# Patient Record
Sex: Male | Born: 1945 | Race: White | Hispanic: No | Smoking: Former smoker
Health system: Southern US, Community
[De-identification: ages and names within clinical notes are randomized; demographics above are authoritative.]

## PROBLEM LIST (undated history)

## (undated) DIAGNOSIS — M1990 Unspecified osteoarthritis, unspecified site: Secondary | ICD-10-CM

## (undated) DIAGNOSIS — N200 Calculus of kidney: Secondary | ICD-10-CM

## (undated) DIAGNOSIS — F32A Depression, unspecified: Secondary | ICD-10-CM

## (undated) DIAGNOSIS — D696 Thrombocytopenia, unspecified: Secondary | ICD-10-CM

## (undated) DIAGNOSIS — K2100 Gastro-esophageal reflux disease with esophagitis, without bleeding: Secondary | ICD-10-CM

## (undated) DIAGNOSIS — F329 Major depressive disorder, single episode, unspecified: Secondary | ICD-10-CM

## (undated) DIAGNOSIS — F419 Anxiety disorder, unspecified: Secondary | ICD-10-CM

## (undated) DIAGNOSIS — K219 Gastro-esophageal reflux disease without esophagitis: Secondary | ICD-10-CM

## (undated) DIAGNOSIS — K648 Other hemorrhoids: Secondary | ICD-10-CM

## (undated) DIAGNOSIS — J189 Pneumonia, unspecified organism: Secondary | ICD-10-CM

## (undated) DIAGNOSIS — I10 Essential (primary) hypertension: Secondary | ICD-10-CM

## (undated) DIAGNOSIS — Z87442 Personal history of urinary calculi: Secondary | ICD-10-CM

## (undated) DIAGNOSIS — D369 Benign neoplasm, unspecified site: Secondary | ICD-10-CM

## (undated) DIAGNOSIS — M549 Dorsalgia, unspecified: Secondary | ICD-10-CM

## (undated) DIAGNOSIS — M170 Bilateral primary osteoarthritis of knee: Secondary | ICD-10-CM

## (undated) DIAGNOSIS — M25569 Pain in unspecified knee: Secondary | ICD-10-CM

## (undated) HISTORY — PX: COLONOSCOPY: SHX174

## (undated) HISTORY — PX: VASECTOMY: SHX75

## (undated) HISTORY — PX: KNEE SURGERY: SHX244

---

## 2008-05-15 ENCOUNTER — Ambulatory Visit: Payer: Self-pay | Admitting: Urology

## 2008-05-27 ENCOUNTER — Ambulatory Visit: Payer: Self-pay | Admitting: Gastroenterology

## 2010-08-02 ENCOUNTER — Emergency Department (HOSPITAL_COMMUNITY): Admission: EM | Admit: 2010-08-02 | Discharge: 2010-08-02 | Payer: Self-pay | Admitting: Emergency Medicine

## 2010-11-11 ENCOUNTER — Encounter
Admission: RE | Admit: 2010-11-11 | Discharge: 2010-11-11 | Payer: Self-pay | Source: Home / Self Care | Admitting: Neurosurgery

## 2011-02-19 LAB — COMPREHENSIVE METABOLIC PANEL
ALT: 22 U/L (ref 0–53)
AST: 31 U/L (ref 0–37)
Albumin: 4 g/dL (ref 3.5–5.2)
CO2: 27 mEq/L (ref 19–32)
Chloride: 101 mEq/L (ref 96–112)
GFR calc Af Amer: >60 mL/min (ref >60)
GFR calc non Af Amer: >60 mL/min (ref >60)
Potassium: 3.8 mEq/L (ref 3.5–5.1)
Sodium: 131 mEq/L — ABNORMAL LOW (ref 135–145)
Total Bilirubin: 0.8 mg/dL (ref 0.3–1.2)

## 2011-02-19 LAB — SAMPLE TO BLOOD BANK

## 2011-02-19 LAB — LACTIC ACID, PLASMA: Lactic Acid, Venous: 1.8 mmol/L (ref 0.5–2.2)

## 2011-02-19 LAB — POCT I-STAT, CHEM 8
BUN: 18 mg/dL (ref 6–23)
Calcium, Ion: 1.08 mmol/L — ABNORMAL LOW (ref 1.12–1.32)
Chloride: 103 mEq/L (ref 96–112)
Glucose, Bld: 109 mg/dL — ABNORMAL HIGH (ref 70–99)
HCT: 49 % (ref 39.0–52.0)
Potassium: 4 mEq/L (ref 3.5–5.1)

## 2011-02-19 LAB — CBC
Hemoglobin: 15.9 g/dL (ref 13.0–17.0)
Platelets: 110 10*3/uL — ABNORMAL LOW (ref 150–400)
RBC: 5.2 MIL/uL (ref 4.22–5.81)
WBC: 9.3 10*3/uL (ref 4.0–10.5)

## 2011-08-13 ENCOUNTER — Ambulatory Visit: Payer: Self-pay | Admitting: Gastroenterology

## 2014-07-22 DIAGNOSIS — G894 Chronic pain syndrome: Secondary | ICD-10-CM | POA: Insufficient documentation

## 2014-08-06 ENCOUNTER — Emergency Department (HOSPITAL_COMMUNITY)
Admission: EM | Admit: 2014-08-06 | Discharge: 2014-08-06 | Disposition: A | Discharge status: Home / Self Care | Payer: Medicare Other | Attending: Emergency Medicine | Admitting: Emergency Medicine

## 2014-08-06 ENCOUNTER — Encounter (HOSPITAL_COMMUNITY): Payer: Self-pay | Admitting: Emergency Medicine

## 2014-08-06 DIAGNOSIS — Y9389 Activity, other specified: Secondary | ICD-10-CM | POA: Insufficient documentation

## 2014-08-06 DIAGNOSIS — H5710 Ocular pain, unspecified eye: Secondary | ICD-10-CM | POA: Diagnosis present

## 2014-08-06 DIAGNOSIS — F17200 Nicotine dependence, unspecified, uncomplicated: Secondary | ICD-10-CM | POA: Insufficient documentation

## 2014-08-06 DIAGNOSIS — S0501XA Injury of conjunctiva and corneal abrasion without foreign body, right eye, initial encounter: Secondary | ICD-10-CM

## 2014-08-06 DIAGNOSIS — S0502XA Injury of conjunctiva and corneal abrasion without foreign body, left eye, initial encounter: Secondary | ICD-10-CM

## 2014-08-06 DIAGNOSIS — S058X9A Other injuries of unspecified eye and orbit, initial encounter: Secondary | ICD-10-CM | POA: Insufficient documentation

## 2014-08-06 DIAGNOSIS — X58XXXA Exposure to other specified factors, initial encounter: Secondary | ICD-10-CM | POA: Insufficient documentation

## 2014-08-06 DIAGNOSIS — Y929 Unspecified place or not applicable: Secondary | ICD-10-CM | POA: Diagnosis not present

## 2014-08-06 HISTORY — DX: Pain in unspecified knee: M25.569

## 2014-08-06 HISTORY — DX: Dorsalgia, unspecified: M54.9

## 2014-08-06 MED ORDER — TETRACAINE HCL 0.5 % OP SOLN
OPHTHALMIC | Status: AC
  Administered 2014-08-06: 20:00:00
  Filled 2014-08-06: qty 2

## 2014-08-06 MED ORDER — FLUORESCEIN SODIUM 1 MG OP STRP
ORAL_STRIP | OPHTHALMIC | Status: AC
  Administered 2014-08-06: 20:00:00
  Filled 2014-08-06: qty 1

## 2014-08-06 MED ORDER — ERYTHROMYCIN 5 MG/GM OP OINT: TOPICAL_OINTMENT | OPHTHALMIC | Status: DC

## 2014-08-06 MED ORDER — HYPROMELLOSE (GONIOSCOPIC) 2.5 % OP SOLN: 2.0000 [drp] | Freq: Three times a day (TID) | OPHTHALMIC | Status: DC | PRN

## 2014-08-06 MED ORDER — HYDROCODONE-ACETAMINOPHEN 5-325 MG PO TABS: 1.0000 | ORAL_TABLET | ORAL | Status: DC | PRN

## 2014-08-06 NOTE — ED Provider Notes (Signed)
TIME SEEN: 7:29 PM  CHIEF COMPLAINT: Bilateral eye pain, tearing, blurry vision  HPI: Patient is a 68 y.o. M with history of tobacco use who presents to the emergency department complaints of bilateral ear pain, photophobia, tearing. He reports that yesterday he was working with his friend helping to put on floors and he was using a small. He states there was a lot of dust in the room. He states he woke up this morning and he felt his eyes were very irritated and tearing. He did have some mild blurry vision. He states that throughout the day his eyes got worse and worse and he kept rubbing them. He is now having pain and unable to keep his eyes open in the light. Denies any vision loss. Denies any headache, vomiting. He does not wear glasses or contacts. Does not have an optometrist or ophthalmologist.  ROS: See HPI Constitutional: no fever  Eyes: no drainage  ENT: no runny nose   Cardiovascular:  no chest pain  Resp: no SOB  GI: no vomiting GU: no dysuria Integumentary: no rash  Allergy: no hives  Musculoskeletal: no leg swelling  Neurological: no slurred speech ROS otherwise negative  PAST MEDICAL HISTORY/PAST SURGICAL HISTORY:  Past Medical History  Diagnosis Date  . Back pain   . Knee pain     MEDICATIONS:  Prior to Admission medications   Not on File    ALLERGIES:  Allergies  Allergen Reactions  . Sulfa Antibiotics     SOCIAL HISTORY:  History  Substance Use Topics  . Smoking status: Current Every Day Smoker  . Smokeless tobacco: Not on file  . Alcohol Use: No    FAMILY HISTORY: No family history on file.  EXAM: BP 175/89  Pulse 55  Temp(Src) 97.9 F (36.6 C) (Oral)  Resp 18  Ht 5\' 11"  (1.803 m)  Wt 236 lb (107.049 kg)  BMI 32.93 kg/m2  SpO2 98% CONSTITUTIONAL: Alert and oriented and responds appropriately to questions. Well-appearing; well-nourished HEAD: Normocephalic EYES:  PERRL, no pain with consensual light response, extraocular movements intact  and painless, mild conjunctival irritation bilaterally, no scleral icterus, no hyphema or hypopyon, patient has photophobia, there is increased tearing, with fluoroscopy seen staining patient has bilateral large central corneal abrasions, no corneal ulcer, normal funduscopic exam, no papilledema, extraocular pressure and ties is measured at 17 mm of mercury ENT: normal nose; no rhinorrhea; moist mucous membranes; pharynx without lesions noted NECK: Supple, no meningismus, no LAD  CARD: RRR; S1 and S2 appreciated; no murmurs, no clicks, no rubs, no gallops RESP: Normal chest excursion without splinting or tachypnea; breath sounds clear and equal bilaterally; no wheezes, no rhonchi, no rales,  ABD/GI: Normal bowel sounds; non-distended; soft, non-tender, no rebound, no guarding BACK:  The back appears normal and is non-tender to palpation, there is no CVA tenderness EXT: Normal ROM in all joints; non-tender to palpation; no edema; normal capillary refill; no cyanosis    SKIN: Normal color for age and race; warm NEURO: Moves all extremities equally PSYCH: The patient's mood and manner are appropriate. Grooming and personal hygiene are appropriate.  MEDICAL DECISION MAKING: Patient here with likely mild allergic conjunctivitis and bilateral corneal abrasions from rubbing his eyes continuously today. No sign of globe injury. No hyphema. His vision improves after his eyes have been anesthetized using tetracaine. Vision is 20/50 in bilateral eyes. Have advised him that he should not drive or work until his symptoms have resolved. We'll give ophthalmology followup. We'll discharge  with artificial tears, erythromycin ointment and pain medication. Have discussed with him return precautions and supportive care instructions. Advised him to stop rubbing his eyes. Patient verbalizes understanding and is comfortable with plan.      [place], DO 08/06/14 2141

## 2014-08-06 NOTE — ED Notes (Signed)
Pt says he is not able at this time to so visual acuity due to sensitive to light and eyes are painful when air hits them.

## 2014-08-06 NOTE — Discharge Instructions (Signed)
Corneal Abrasion °The cornea is the clear covering at the front and center of the eye. When looking at the colored portion of the eye (iris), you are looking through the cornea. This very thin tissue is made up of many layers. The surface layer is a single layer of cells (corneal epithelium) and is one of the most sensitive tissues in the body. If a scratch or injury causes the corneal epithelium to come off, it is called a corneal abrasion. If the injury extends to the tissues below the epithelium, the condition is called a corneal ulcer. °CAUSES  °· Scratches. °· Trauma. °· Foreign body in the eye. °Some people have recurrences of abrasions in the area of the original injury even after it has healed (recurrent erosion syndrome). Recurrent erosion syndrome generally improves and goes away with time. °SYMPTOMS  °· Eye pain. °· Difficulty or inability to keep the injured eye open. °· The eye becomes very sensitive to light. °· Recurrent erosions tend to happen suddenly, first thing in the morning, usually after waking up and opening the eye. °DIAGNOSIS  °Your health care provider can diagnose a corneal abrasion during an eye exam. Dye is usually placed in the eye using a drop or a small paper strip moistened by your tears. When the eye is examined with a special light, the abrasion shows up clearly because of the dye. °TREATMENT  °· Small abrasions may be treated with antibiotic drops or ointment alone. °· A pressure patch may be put over the eye. If this is done, follow your doctor's instructions for when to remove the patch. Do not drive or use machines while the eye patch is on. Judging distances is hard to do with a patch on. °If the abrasion becomes infected and spreads to the deeper tissues of the cornea, a corneal ulcer can result. This is serious because it can cause corneal scarring. Corneal scars interfere with light passing through the cornea and cause a loss of vision in the involved eye. °HOME CARE  INSTRUCTIONS °· Use medicine or ointment as directed. Only take over-the-counter or prescription medicines for pain, discomfort, or fever as directed by your health care provider. °· Do not drive or operate machinery if your eye is patched. Your ability to judge distances is impaired. °· If your health care provider has given you a follow-up appointment, it is very important to keep that appointment. Not keeping the appointment could result in a severe eye infection or permanent loss of vision. If there is any problem keeping the appointment, let your health care provider know. °SEEK MEDICAL CARE IF:  °· You have pain, light sensitivity, and a scratchy feeling in one eye or both eyes. °· Your pressure patch keeps loosening up, and you can blink your eye under the patch after treatment. °· Any kind of discharge develops from the eye after treatment or if the lids stick together in the morning. °· You have the same symptoms in the morning as you did with the original abrasion days, weeks, or months after the abrasion healed. °MAKE SURE YOU:  °· Understand these instructions. °· Will watch your condition. °· Will get help right away if you are not doing well or get worse. °Document Released: 11/19/2000 Document Revised: 11/27/2013 Document Reviewed: 07/30/2013 °ExitCare® Patient Information ©2015 [employer]. This information is not intended to replace advice given to you by your health care provider. Make sure you discuss any questions you have with your health care provider. ° °

## 2014-08-06 NOTE — ED Notes (Signed)
Pt reports woke up this morning with "foggy" vision and as the day went on eyes started to water, swell,  and burn.  Pt says is very sensitive to light and is unable to hold his eyes open.  Says woke up with runny nose this morning.  Denies getting anything in eyes.

## 2014-09-16 ENCOUNTER — Ambulatory Visit: Payer: Self-pay | Admitting: Gastroenterology

## 2014-09-16 LAB — CBC WITH DIFFERENTIAL/PLATELET
BASOS PCT: 1.1 %
Basophil #: 0.1 10*3/uL (ref 0.0–0.1)
EOS ABS: 0.1 10*3/uL (ref 0.0–0.7)
EOS PCT: 1.4 %
HCT: 45.8 % (ref 40.0–52.0)
HGB: 14.7 g/dL (ref 13.0–18.0)
LYMPHS PCT: 19 %
Lymphocyte #: 1.4 10*3/uL (ref 1.0–3.6)
MCH: 28.4 pg (ref 26.0–34.0)
MCHC: 32 g/dL (ref 32.0–36.0)
MCV: 89 fL (ref 80–100)
Monocyte #: 0.5 x10 3/mm (ref 0.2–1.0)
Monocyte %: 7.1 %
Neutrophil #: 5.3 10*3/uL (ref 1.4–6.5)
Neutrophil %: 71.4 %
PLATELETS: 121 10*3/uL — AB (ref 150–440)
RBC: 5.16 10*6/uL (ref 4.40–5.90)
RDW: 13.5 % (ref 11.5–14.5)
WBC: 7.4 10*3/uL (ref 3.8–10.6)

## 2014-09-18 LAB — PATHOLOGY REPORT

## 2015-01-04 ENCOUNTER — Emergency Department: Payer: Self-pay | Admitting: Internal Medicine

## 2015-01-04 LAB — URINALYSIS, COMPLETE
BLOOD: NEGATIVE
Bacteria: NONE SEEN
Bilirubin,UR: NEGATIVE
Glucose,UR: NEGATIVE mg/dL (ref 0–75)
Ketone: NEGATIVE
LEUKOCYTE ESTERASE: NEGATIVE
NITRITE: NEGATIVE
PROTEIN: NEGATIVE
Ph: 5 (ref 4.5–8.0)
RBC,UR: <1 /HPF (ref 0–5)
SPECIFIC GRAVITY: 1.016 (ref 1.003–1.030)
Squamous Epithelial: NONE SEEN
WBC UR: 1 /HPF (ref 0–5)

## 2015-01-04 LAB — COMPREHENSIVE METABOLIC PANEL
ALBUMIN: 3.8 g/dL (ref 3.4–5.0)
Alkaline Phosphatase: 71 U/L (ref 46–116)
Anion Gap: 2 — ABNORMAL LOW (ref 7–16)
BUN: 23 mg/dL — ABNORMAL HIGH (ref 7–18)
Bilirubin,Total: 0.6 mg/dL (ref 0.2–1.0)
CALCIUM: 8.7 mg/dL (ref 8.5–10.1)
Chloride: 99 mmol/L (ref 98–107)
Co2: 36 mmol/L — ABNORMAL HIGH (ref 21–32)
Creatinine: 1.03 mg/dL (ref 0.60–1.30)
EGFR (African American): >60
EGFR (Non-African Amer.): >60
GLUCOSE: 95 mg/dL (ref 65–99)
OSMOLALITY: 277 (ref 275–301)
POTASSIUM: 4.6 mmol/L (ref 3.5–5.1)
SGOT(AST): 18 U/L (ref 15–37)
SGPT (ALT): 24 U/L (ref 14–63)
SODIUM: 137 mmol/L (ref 136–145)
Total Protein: 7 g/dL (ref 6.4–8.2)

## 2015-01-04 LAB — CBC
HCT: 49.8 % (ref 40.0–52.0)
HGB: 16.6 g/dL (ref 13.0–18.0)
MCH: 28.9 pg (ref 26.0–34.0)
MCHC: 33.2 g/dL (ref 32.0–36.0)
MCV: 87 fL (ref 80–100)
Platelet: 102 10*3/uL — ABNORMAL LOW (ref 150–440)
RBC: 5.74 10*6/uL (ref 4.40–5.90)
RDW: 14.9 % — ABNORMAL HIGH (ref 11.5–14.5)
WBC: 12.1 10*3/uL — ABNORMAL HIGH (ref 3.8–10.6)

## 2015-01-08 ENCOUNTER — Encounter (HOSPITAL_COMMUNITY): Payer: Self-pay | Admitting: *Deleted

## 2015-01-09 ENCOUNTER — Encounter (HOSPITAL_COMMUNITY): Payer: Self-pay | Admitting: *Deleted

## 2015-01-09 ENCOUNTER — Ambulatory Visit (HOSPITAL_COMMUNITY)
Admission: RE | Admit: 2015-01-09 | Discharge: 2015-01-10 | Disposition: A | Discharge status: Home / Self Care | Payer: Medicare Other | Source: Ambulatory Visit | Attending: Neurosurgery | Admitting: Neurosurgery

## 2015-01-09 ENCOUNTER — Encounter (HOSPITAL_COMMUNITY)
Admission: RE | Disposition: A | Discharge status: Home / Self Care | Payer: Self-pay | Source: Ambulatory Visit | Attending: Neurosurgery

## 2015-01-09 ENCOUNTER — Inpatient Hospital Stay (HOSPITAL_COMMUNITY): Payer: Medicare Other | Admitting: Critical Care Medicine

## 2015-01-09 ENCOUNTER — Inpatient Hospital Stay (HOSPITAL_COMMUNITY): Payer: Medicare Other

## 2015-01-09 DIAGNOSIS — Z87442 Personal history of urinary calculi: Secondary | ICD-10-CM | POA: Insufficient documentation

## 2015-01-09 DIAGNOSIS — F329 Major depressive disorder, single episode, unspecified: Secondary | ICD-10-CM | POA: Insufficient documentation

## 2015-01-09 DIAGNOSIS — M1990 Unspecified osteoarthritis, unspecified site: Secondary | ICD-10-CM | POA: Diagnosis not present

## 2015-01-09 DIAGNOSIS — Z882 Allergy status to sulfonamides status: Secondary | ICD-10-CM | POA: Insufficient documentation

## 2015-01-09 DIAGNOSIS — Z79899 Other long term (current) drug therapy: Secondary | ICD-10-CM | POA: Insufficient documentation

## 2015-01-09 DIAGNOSIS — M5126 Other intervertebral disc displacement, lumbar region: Secondary | ICD-10-CM | POA: Diagnosis present

## 2015-01-09 HISTORY — DX: Depression, unspecified: F32.A

## 2015-01-09 HISTORY — DX: Essential (primary) hypertension: I10

## 2015-01-09 HISTORY — DX: Calculus of kidney: N20.0

## 2015-01-09 HISTORY — PX: LUMBAR LAMINECTOMY/DECOMPRESSION MICRODISCECTOMY: SHX5026

## 2015-01-09 HISTORY — DX: Unspecified osteoarthritis, unspecified site: M19.90

## 2015-01-09 HISTORY — DX: Major depressive disorder, single episode, unspecified: F32.9

## 2015-01-09 LAB — CBC
HCT: 46.1 % (ref 39.0–52.0)
Hemoglobin: 15.3 g/dL (ref 13.0–17.0)
MCH: 28.9 pg (ref 26.0–34.0)
MCHC: 33.2 g/dL (ref 30.0–36.0)
MCV: 87.1 fL (ref 78.0–100.0)
Platelets: 102 10*3/uL — ABNORMAL LOW (ref 150–400)
RBC: 5.29 MIL/uL (ref 4.22–5.81)
RDW: 14.6 % (ref 11.5–15.5)
WBC: 10.6 10*3/uL — AB (ref 4.0–10.5)

## 2015-01-09 LAB — BASIC METABOLIC PANEL
Anion gap: 7 (ref 5–15)
BUN: 25 mg/dL — ABNORMAL HIGH (ref 6–23)
CO2: 30 mmol/L (ref 19–32)
Calcium: 8.9 mg/dL (ref 8.4–10.5)
Chloride: 103 mmol/L (ref 96–112)
Creatinine, Ser: 1.1 mg/dL (ref 0.50–1.35)
GFR calc Af Amer: 79 mL/min — ABNORMAL LOW (ref >90)
GFR calc non Af Amer: 68 mL/min — ABNORMAL LOW (ref >90)
Glucose, Bld: 80 mg/dL (ref 70–99)
Potassium: 5 mmol/L (ref 3.5–5.1)
Sodium: 140 mmol/L (ref 135–145)

## 2015-01-09 LAB — SURGICAL PCR SCREEN
MRSA, PCR: NEGATIVE
Staphylococcus aureus: NEGATIVE

## 2015-01-09 SURGERY — LUMBAR LAMINECTOMY/DECOMPRESSION MICRODISCECTOMY 1 LEVEL
Anesthesia: General | Site: Spine Lumbar | Laterality: Left

## 2015-01-09 MED ORDER — PROPOFOL 10 MG/ML IV BOLUS
INTRAVENOUS | Status: AC
  Filled 2015-01-09: qty 20

## 2015-01-09 MED ORDER — THROMBIN 5000 UNITS EX SOLR
CUTANEOUS | Status: DC | PRN
  Administered 2015-01-09 (×2): 5000 [IU] via TOPICAL

## 2015-01-09 MED ORDER — FENTANYL CITRATE 0.05 MG/ML IJ SOLN
INTRAMUSCULAR | Status: AC
  Filled 2015-01-09: qty 5

## 2015-01-09 MED ORDER — HYDROCODONE-ACETAMINOPHEN 5-325 MG PO TABS: 1.0000 | ORAL_TABLET | ORAL | Status: DC | PRN

## 2015-01-09 MED ORDER — POLYETHYLENE GLYCOL 3350 17 G PO PACK
17.0000 g | PACK | Freq: Every day | ORAL | Status: DC | PRN
  Filled 2015-01-09: qty 1

## 2015-01-09 MED ORDER — SODIUM CHLORIDE 0.9 % IJ SOLN
3.0000 mL | Freq: Two times a day (BID) | INTRAMUSCULAR | Status: DC
  Administered 2015-01-09: 3 mL via INTRAVENOUS

## 2015-01-09 MED ORDER — LACTATED RINGERS IV SOLN
INTRAVENOUS | Status: DC | PRN
  Administered 2015-01-09 (×3): via INTRAVENOUS

## 2015-01-09 MED ORDER — HYDROMORPHONE HCL 1 MG/ML IJ SOLN
0.2500 mg | INTRAMUSCULAR | Status: DC | PRN
  Administered 2015-01-09: 0.5 mg via INTRAVENOUS
  Administered 2015-01-09 (×2): 0.25 mg via INTRAVENOUS

## 2015-01-09 MED ORDER — LIDOCAINE-EPINEPHRINE 0.5 %-1:200000 IJ SOLN
INTRAMUSCULAR | Status: DC | PRN
  Administered 2015-01-09: 10 mL

## 2015-01-09 MED ORDER — ONDANSETRON HCL 4 MG/2ML IJ SOLN
INTRAMUSCULAR | Status: DC | PRN
  Administered 2015-01-09: 4 mg via INTRAVENOUS

## 2015-01-09 MED ORDER — 0.9 % SODIUM CHLORIDE (POUR BTL) OPTIME
TOPICAL | Status: DC | PRN
  Administered 2015-01-09: 1000 mL

## 2015-01-09 MED ORDER — ROCURONIUM BROMIDE 100 MG/10ML IV SOLN
INTRAVENOUS | Status: DC | PRN
  Administered 2015-01-09: 40 mg via INTRAVENOUS

## 2015-01-09 MED ORDER — LIDOCAINE HCL (CARDIAC) 20 MG/ML IV SOLN
INTRAVENOUS | Status: DC | PRN
  Administered 2015-01-09: 100 mg via INTRAVENOUS

## 2015-01-09 MED ORDER — SENNA 8.6 MG PO TABS
1.0000 | ORAL_TABLET | Freq: Two times a day (BID) | ORAL | Status: DC
  Administered 2015-01-09: 8.6 mg via ORAL
  Filled 2015-01-09 (×3): qty 1

## 2015-01-09 MED ORDER — PROMETHAZINE HCL 25 MG/ML IJ SOLN
6.2500 mg | INTRAMUSCULAR | Status: DC | PRN
Start: 2015-01-09 — End: 2015-01-09

## 2015-01-09 MED ORDER — ARTIFICIAL TEARS OP OINT
TOPICAL_OINTMENT | OPHTHALMIC | Status: DC | PRN
  Administered 2015-01-09: 1 via OPHTHALMIC

## 2015-01-09 MED ORDER — ONDANSETRON HCL 4 MG/2ML IJ SOLN
INTRAMUSCULAR | Status: AC
  Filled 2015-01-09: qty 2

## 2015-01-09 MED ORDER — STERILE WATER FOR INJECTION IJ SOLN
INTRAMUSCULAR | Status: AC
  Filled 2015-01-09: qty 10

## 2015-01-09 MED ORDER — HEMOSTATIC AGENTS (NO CHARGE) OPTIME
TOPICAL | Status: DC | PRN
  Administered 2015-01-09: 1 via TOPICAL

## 2015-01-09 MED ORDER — ACETAMINOPHEN 650 MG RE SUPP: 650.0000 mg | RECTAL | Status: DC | PRN

## 2015-01-09 MED ORDER — HYDROMORPHONE HCL 1 MG/ML IJ SOLN: 0.5000 mg | INTRAMUSCULAR | Status: DC | PRN

## 2015-01-09 MED ORDER — MIDAZOLAM HCL 2 MG/2ML IJ SOLN
INTRAMUSCULAR | Status: AC
  Filled 2015-01-09: qty 2

## 2015-01-09 MED ORDER — EPHEDRINE SULFATE 50 MG/ML IJ SOLN
INTRAMUSCULAR | Status: AC
  Filled 2015-01-09: qty 1

## 2015-01-09 MED ORDER — ONDANSETRON HCL 4 MG/2ML IJ SOLN: 4.0000 mg | INTRAMUSCULAR | Status: DC | PRN

## 2015-01-09 MED ORDER — DIAZEPAM 5 MG PO TABS
5.0000 mg | ORAL_TABLET | Freq: Four times a day (QID) | ORAL | Status: DC | PRN
  Administered 2015-01-09: 5 mg via ORAL

## 2015-01-09 MED ORDER — HYDROMORPHONE HCL 1 MG/ML IJ SOLN
INTRAMUSCULAR | Status: AC
  Filled 2015-01-09: qty 1

## 2015-01-09 MED ORDER — NEOSTIGMINE METHYLSULFATE 10 MG/10ML IV SOLN
INTRAVENOUS | Status: DC | PRN
  Administered 2015-01-09: 4 mg via INTRAVENOUS

## 2015-01-09 MED ORDER — LACTATED RINGERS IV SOLN
INTRAVENOUS | Status: DC
  Administered 2015-01-09: 11:00:00 via INTRAVENOUS

## 2015-01-09 MED ORDER — PROPOFOL 10 MG/ML IV BOLUS
INTRAVENOUS | Status: DC | PRN
  Administered 2015-01-09: 160 mg via INTRAVENOUS
  Administered 2015-01-09: 40 mg via INTRAVENOUS

## 2015-01-09 MED ORDER — SODIUM CHLORIDE 0.9 % IV SOLN: 250.0000 mL | INTRAVENOUS | Status: DC

## 2015-01-09 MED ORDER — LIDOCAINE HCL (CARDIAC) 20 MG/ML IV SOLN
INTRAVENOUS | Status: AC
  Filled 2015-01-09: qty 5

## 2015-01-09 MED ORDER — CEFAZOLIN SODIUM-DEXTROSE 2-3 GM-% IV SOLR
INTRAVENOUS | Status: DC | PRN
  Administered 2015-01-09: 2 g via INTRAVENOUS

## 2015-01-09 MED ORDER — MIDAZOLAM HCL 5 MG/5ML IJ SOLN
INTRAMUSCULAR | Status: DC | PRN
  Administered 2015-01-09: 2 mg via INTRAVENOUS

## 2015-01-09 MED ORDER — EPHEDRINE SULFATE 50 MG/ML IJ SOLN
INTRAMUSCULAR | Status: DC | PRN
  Administered 2015-01-09 (×2): 10 mg via INTRAVENOUS

## 2015-01-09 MED ORDER — MUPIROCIN 2 % EX OINT
1.0000 "application " | TOPICAL_OINTMENT | Freq: Once | CUTANEOUS | Status: AC
  Administered 2015-01-09: 1 via TOPICAL

## 2015-01-09 MED ORDER — PHENOL 1.4 % MT LIQD: 1.0000 | OROMUCOSAL | Status: DC | PRN

## 2015-01-09 MED ORDER — OXYCODONE-ACETAMINOPHEN 5-325 MG PO TABS
1.0000 | ORAL_TABLET | ORAL | Status: DC | PRN
  Administered 2015-01-09: 2 via ORAL
  Filled 2015-01-09: qty 2

## 2015-01-09 MED ORDER — GLYCOPYRROLATE 0.2 MG/ML IJ SOLN
INTRAMUSCULAR | Status: DC | PRN
  Administered 2015-01-09: .7 mg via INTRAVENOUS

## 2015-01-09 MED ORDER — MENTHOL 3 MG MT LOZG: 1.0000 | LOZENGE | OROMUCOSAL | Status: DC | PRN

## 2015-01-09 MED ORDER — ACETAMINOPHEN 325 MG PO TABS: 650.0000 mg | ORAL_TABLET | ORAL | Status: DC | PRN

## 2015-01-09 MED ORDER — SODIUM CHLORIDE 0.9 % IJ SOLN: 3.0000 mL | INTRAMUSCULAR | Status: DC | PRN

## 2015-01-09 MED ORDER — POTASSIUM CHLORIDE IN NACL 20-0.9 MEQ/L-% IV SOLN
INTRAVENOUS | Status: DC
  Filled 2015-01-09 (×3): qty 1000

## 2015-01-09 MED ORDER — DIAZEPAM 5 MG PO TABS
ORAL_TABLET | ORAL | Status: AC
  Filled 2015-01-09: qty 1

## 2015-01-09 MED ORDER — KETOROLAC TROMETHAMINE 30 MG/ML IJ SOLN
30.0000 mg | Freq: Four times a day (QID) | INTRAMUSCULAR | Status: DC
  Administered 2015-01-09 – 2015-01-10 (×2): 30 mg via INTRAVENOUS
  Filled 2015-01-09 (×7): qty 1

## 2015-01-09 MED ORDER — MUPIROCIN 2 % EX OINT
TOPICAL_OINTMENT | CUTANEOUS | Status: AC
  Filled 2015-01-09: qty 22

## 2015-01-09 MED ORDER — FENTANYL CITRATE 0.05 MG/ML IJ SOLN
INTRAMUSCULAR | Status: DC | PRN
  Administered 2015-01-09: 50 ug via INTRAVENOUS
  Administered 2015-01-09: 150 ug via INTRAVENOUS
  Administered 2015-01-09: 50 ug via INTRAVENOUS

## 2015-01-09 SURGICAL SUPPLY — 48 items
BAG DECANTER FOR FLEXI CONT (MISCELLANEOUS) ×3 IMPLANT
BENZOIN TINCTURE PRP APPL 2/3 (GAUZE/BANDAGES/DRESSINGS) ×3 IMPLANT
BLADE CLIPPER SURG (BLADE) IMPLANT
BUR MATCHSTICK NEURO 3.0 LAGG (BURR) ×3 IMPLANT
CANISTER SUCT 3000ML (MISCELLANEOUS) ×3 IMPLANT
CLOSURE WOUND 1/2 X4 (GAUZE/BANDAGES/DRESSINGS)
CONT SPEC 4OZ CLIKSEAL STRL BL (MISCELLANEOUS) ×3 IMPLANT
DECANTER SPIKE VIAL GLASS SM (MISCELLANEOUS) ×3 IMPLANT
DRAPE LAPAROTOMY 100X72X124 (DRAPES) ×3 IMPLANT
DRAPE MICROSCOPE LEICA (MISCELLANEOUS) ×3 IMPLANT
DRAPE POUCH INSTRU U-SHP 10X18 (DRAPES) ×3 IMPLANT
DRAPE SURG 17X23 STRL (DRAPES) ×3 IMPLANT
DURAPREP 26ML APPLICATOR (WOUND CARE) ×3 IMPLANT
ELECT REM PT RETURN 9FT ADLT (ELECTROSURGICAL) ×3
ELECTRODE REM PT RTRN 9FT ADLT (ELECTROSURGICAL) ×1 IMPLANT
GAUZE SPONGE 4X4 12PLY STRL (GAUZE/BANDAGES/DRESSINGS) IMPLANT
GAUZE SPONGE 4X4 16PLY XRAY LF (GAUZE/BANDAGES/DRESSINGS) IMPLANT
GLOVE BIOGEL PI IND STRL 7.5 (GLOVE) ×2 IMPLANT
GLOVE BIOGEL PI IND STRL 8 (GLOVE) ×2 IMPLANT
GLOVE BIOGEL PI INDICATOR 7.5 (GLOVE) ×4
GLOVE BIOGEL PI INDICATOR 8 (GLOVE) ×4
GLOVE ECLIPSE 7.5 STRL STRAW (GLOVE) ×12 IMPLANT
GLOVE ECLIPSE 8.0 STRL XLNG CF (GLOVE) ×3 IMPLANT
GOWN STRL REUS W/ TWL LRG LVL3 (GOWN DISPOSABLE) ×2 IMPLANT
GOWN STRL REUS W/ TWL XL LVL3 (GOWN DISPOSABLE) ×3 IMPLANT
GOWN STRL REUS W/TWL 2XL LVL3 (GOWN DISPOSABLE) IMPLANT
GOWN STRL REUS W/TWL LRG LVL3 (GOWN DISPOSABLE) ×4
GOWN STRL REUS W/TWL XL LVL3 (GOWN DISPOSABLE) ×6
KIT BASIN OR (CUSTOM PROCEDURE TRAY) ×3 IMPLANT
KIT ROOM TURNOVER OR (KITS) ×3 IMPLANT
LIQUID BAND (GAUZE/BANDAGES/DRESSINGS) ×3 IMPLANT
NEEDLE HYPO 25X1 1.5 SAFETY (NEEDLE) ×3 IMPLANT
NEEDLE SPNL 18GX3.5 QUINCKE PK (NEEDLE) ×3 IMPLANT
NS IRRIG 1000ML POUR BTL (IV SOLUTION) ×3 IMPLANT
PACK LAMINECTOMY NEURO (CUSTOM PROCEDURE TRAY) ×3 IMPLANT
PAD ARMBOARD 7.5X6 YLW CONV (MISCELLANEOUS) ×9 IMPLANT
RUBBERBAND STERILE (MISCELLANEOUS) ×6 IMPLANT
SPONGE LAP 4X18 X RAY DECT (DISPOSABLE) IMPLANT
SPONGE SURGIFOAM ABS GEL SZ50 (HEMOSTASIS) ×3 IMPLANT
STRIP CLOSURE SKIN 1/2X4 (GAUZE/BANDAGES/DRESSINGS) IMPLANT
SUT VIC AB 0 CT1 18XCR BRD8 (SUTURE) ×1 IMPLANT
SUT VIC AB 0 CT1 8-18 (SUTURE) ×2
SUT VIC AB 2-0 CT1 18 (SUTURE) ×3 IMPLANT
SUT VIC AB 3-0 SH 8-18 (SUTURE) ×3 IMPLANT
SYR 20ML ECCENTRIC (SYRINGE) ×3 IMPLANT
TOWEL OR 17X24 6PK STRL BLUE (TOWEL DISPOSABLE) ×3 IMPLANT
TOWEL OR 17X26 10 PK STRL BLUE (TOWEL DISPOSABLE) ×3 IMPLANT
WATER STERILE IRR 1000ML POUR (IV SOLUTION) ×3 IMPLANT

## 2015-01-09 NOTE — Anesthesia Procedure Notes (Signed)
Procedure Name: Intubation Date/Time: 01/09/2015 1:46 PM Performed by: Ollen Bowl Pre-anesthesia Checklist: Patient identified, Emergency Drugs available, Suction available, Patient being monitored and Timeout performed Patient Re-evaluated:Patient Re-evaluated prior to inductionOxygen Delivery Method: Circle system utilized and Simple face mask Preoxygenation: Pre-oxygenation with 100% oxygen Intubation Type: IV induction Ventilation: Mask ventilation without difficulty and Oral airway inserted - appropriate to patient size Laryngoscope Size: Mac and 4 Grade View: Grade II Tube type: Oral Tube size: 7.5 mm Number of attempts: 1 Airway Equipment and Method: Patient positioned with wedge pillow and Stylet Placement Confirmation: ETT inserted through vocal cords under direct vision,  positive ETCO2 and breath sounds checked- equal and bilateral Secured at: 22 cm Tube secured with: Tape Dental Injury: Teeth and Oropharynx as per pre-operative assessment

## 2015-01-09 NOTE — Anesthesia Postprocedure Evaluation (Signed)
  Anesthesia Post-op Note  Patient: Andrew SERIO Sr.  Procedure(s) Performed: Procedure(s) with comments: LUMBAR FOUR-FIVE LAMINECTOMY/DECOMPRESSION MICRODISCECTOMY 1 LEVEL (Left) - Left L45 microdiskectomy  Patient Location: PACU  Anesthesia Type:General  Level of Consciousness: awake  Airway and Oxygen Therapy: Patient Spontanous Breathing  Post-op Pain: mild  Post-op Assessment: Post-op Vital signs reviewed  Post-op Vital Signs: Reviewed  Last Vitals:  Filed Vitals:   01/09/15 1741  BP:   Pulse:   Temp: 36.4 C  Resp:     Complications: No apparent anesthesia complications

## 2015-01-09 NOTE — Op Note (Signed)
01/09/2015  3:48 PM  PATIENT:  Andrew Balls Sr.  69 y.o. 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 was taken to the operating room, intubated and placed under a general anesthetic without difficulty. He was positioned prone on a Glastetter frame with all pressure points padded. His back was prepped and draped in a sterile manner. I opened the skin with a 10 blade and carried the dissection down to the thoracolumbar fascia. I used both sharp dissection and the monopolar cautery to expose the lamina of L4, and L5. I confirmed my location with an intraoperative xray.  I used the drill, Kerrison punches, and curettes to perform a semihemilaminectomy of L4. I used the punches to remove the ligamentum flavum to expose the thecal sac. I brought the microscope into the operative field and with Dr.Kritzer's assistance we started our decompression of the spinal canal, thecal sac and L5 root(s). I cauterized epidural veins overlying the disc space then divided them sharply. I opened the disc space with a 15 blade and proceeded with the discectomy. I used pituitary rongeurs, curettes, and other instruments to remove disc material. After the discectomy was completed we inspected the L5 nerve root and felt it was well decompressed. I explored rostrally, laterally, medially, and caudally and was satisfied with the decompression. I irrigated the wound, then closed in layers. I approximated the thoracolumbar fascia, subcutaneous, and  subcuticular planes with vicryl sutures. I used dermabond for a sterile dressing.   PLAN OF CARE: Admit for overnight observation  PATIENT DISPOSITION:  PACU - hemodynamically stable.   Delay start of Pharmacological VTE agent (>24hrs) due to surgical blood loss or risk of bleeding:  yes

## 2015-01-09 NOTE — Anesthesia Preprocedure Evaluation (Addendum)
Anesthesia Evaluation  Patient identified by MRN, date of birth, ID band Patient awake    Reviewed: Allergy & Precautions, NPO status , Patient's Chart, lab work & pertinent test results, reviewed documented beta blocker date and time   Airway Mallampati: II  TM Distance: >3 FB Neck ROM: Full  Mouth opening: Limited Mouth Opening  Dental  (+) Dental Advisory Given, Teeth Intact   Pulmonary neg pulmonary ROS, former smoker,  breath sounds clear to auscultation        Cardiovascular hypertension, Rhythm:Regular Rate:Normal     Neuro/Psych Depression    GI/Hepatic negative GI ROS, Neg liver ROS,   Endo/Other  negative endocrine ROS  Renal/GU Renal disease     Musculoskeletal  (+) Arthritis -,   Abdominal (+)  Abdomen: soft. Bowel sounds: normal.  Peds  Hematology   Anesthesia Other Findings   Reproductive/Obstetrics                        Anesthesia Physical Anesthesia Plan  ASA: II  Anesthesia Plan: General   Post-op Pain Management:    Induction: Intravenous  Airway Management Planned: Oral ETT  Additional Equipment:   Intra-op Plan:   Post-operative Plan: Extubation in OR  Informed Consent: I have reviewed the patients History and Physical, chart, labs and discussed the procedure including the risks, benefits and alternatives for the proposed anesthesia with the patient or authorized representative who has indicated his/her understanding and acceptance.   Dental advisory given  Plan Discussed with: CRNA and Anesthesiologist  Anesthesia Plan Comments:         Anesthesia Quick Evaluation

## 2015-01-09 NOTE — Transfer of Care (Signed)
Immediate Anesthesia Transfer of Care Note  Patient: Andrew TELFAIR Sr.  Procedure(s) Performed: Procedure(s) with comments: LUMBAR FOUR-FIVE LAMINECTOMY/DECOMPRESSION MICRODISCECTOMY 1 LEVEL (Left) - Left L45 microdiskectomy  Patient Location: PACU  Anesthesia Type:General  Level of Consciousness: awake and alert   Airway & Oxygen Therapy: Patient Spontanous Breathing and Patient connected to nasal cannula oxygen  Post-op Assessment: Report given to RN, Post -op Vital signs reviewed and stable and Patient moving all extremities X 4  Post vital signs: Reviewed and stable  Last Vitals:  Filed Vitals:   01/09/15 1555  BP:   Pulse:   Temp: 36.2 C  Resp:     Complications: No apparent anesthesia complications

## 2015-01-09 NOTE — H&P (Signed)
Andrew SEYMOUR Sr. is an 69 y.o. male.   Chief Complaint: hnp l4/5 left HPI: pain in left lower extremity   Past Medical History  Diagnosis Date  . Back pain   . Knee pain   . Hypertension     doen not tke meds for htn at this time  . Depression   . Kidney stone   . Arthritis     Past Surgical History  Procedure Laterality Date  . Knee surgery    . Colonoscopy    . Vasectomy      History reviewed. No pertinent family history. Social History:  reports that he has quit smoking. He does not have any smokeless tobacco history on file. He reports that he does not drink alcohol or use illicit drugs.  Allergies:  Allergies  Allergen Reactions  . Sulfa Antibiotics     Medications Prior to Admission  Medication Sig Dispense Refill  . HYDROcodone-acetaminophen (NORCO/VICODIN) 5-325 MG per tablet Take 1 tablet by mouth every 4 (four) hours as needed. 15 tablet 0  . oxyCODONE-acetaminophen (PERCOCET/ROXICET) 5-325 MG per tablet Take 1 tablet by mouth every 6 (six) hours as needed (pain).   0  . erythromycin ophthalmic ointment Place a 1/2 inch ribbon of ointment into the lower eyelid. 3.5 g 0  . hydroxypropyl methylcellulose / hypromellose (ISOPTO TEARS / GONIOVISC) 2.5 % ophthalmic solution Place 2 drops into both eyes 3 (three) times daily as needed for dry eyes. 15 mL 12    Results for orders placed or performed during the hospital encounter of 01/09/15 (from the past 48 hour(s))  CBC     Status: Abnormal   Collection Time: 01/09/15 10:43 AM  Result Value Ref Range   WBC 10.6 (H) 4.0 - 10.5 K/uL   RBC 5.29 4.22 - 5.81 MIL/uL   Hemoglobin 15.3 13.0 - 17.0 g/dL   HCT 46.1 39.0 - 52.0 %   MCV 87.1 78.0 - 100.0 fL   MCH 28.9 26.0 - 34.0 pg   MCHC 33.2 30.0 - 36.0 g/dL   RDW 14.6 11.5 - 15.5 %   Platelets 102 (L) 150 - 400 K/uL    Comment: SPECIMEN CHECKED FOR CLOTS REPEATED TO VERIFY PLATELET COUNT CONFIRMED BY SMEAR   Basic metabolic panel     Status: Abnormal   Collection Time: 01/09/15 10:43 AM  Result Value Ref Range   Sodium 140 135 - 145 mmol/L   Potassium 5.0 3.5 - 5.1 mmol/L    Comment: HEMOLYZED SPECIMEN, RESULTS MAY BE AFFECTED   Chloride 103 96 - 112 mmol/L   CO2 30 19 - 32 mmol/L   Glucose, Bld 80 70 - 99 mg/dL   BUN 25 (H) 6 - 23 mg/dL   Creatinine, Ser 1.10 0.50 - 1.35 mg/dL   Calcium 8.9 8.4 - 10.5 mg/dL   GFR calc non Af Amer 68 (L) >90 mL/min   GFR calc Af Amer 79 (L) >90 mL/min    Comment: (NOTE) The eGFR has been calculated using the CKD EPI equation. This calculation has not been validated in all clinical situations. eGFR's persistently <90 mL/min signify possible Chronic Kidney Disease.    Anion gap 7 5 - 15  Surgical pcr screen     Status: None   Collection Time: 01/09/15 10:43 AM  Result Value Ref Range   MRSA, PCR NEGATIVE NEGATIVE   Staphylococcus aureus NEGATIVE NEGATIVE    Comment:        The Xpert SA Assay (FDA approved  for NASAL specimens in patients over 64 years of age), is one component of a comprehensive surveillance program.  Test performance has been validated by [facility] for patients greater than or equal to 40 year old. It is not intended to diagnose infection nor to guide or monitor treatment.    No results found.  Review of Systems  Constitutional: Negative.   HENT: Negative.   Eyes: Negative.   Respiratory: Negative.   Cardiovascular: Negative.   Gastrointestinal: Negative.   Musculoskeletal: Positive for back pain.  Skin: Negative.   Psychiatric/Behavioral: Negative.     Blood pressure 172/75, pulse 54, temperature 97.6 F (36.4 C), temperature source Oral, resp. rate 20, height _0  (1.854 m), weight 104.327 kg (230 lb), SpO2 100 %. Physical Exam  Constitutional: He is oriented to person, place, and time. He appears well-developed and well-nourished.  HENT:  Head: Normocephalic and atraumatic.  Eyes: Pupils are equal, round, and reactive to light.  Neck: Normal range of  motion.  Cardiovascular: Normal rate and regular rhythm.   Respiratory: Effort normal and breath sounds normal.  GI: Soft.  Musculoskeletal: Normal range of motion.  Neurological: He is alert and oriented to person, place, and time. He has normal reflexes. No cranial nerve deficit. He exhibits normal muscle tone. Coordination normal.  Psychiatric: He has a normal mood and affect. His behavior is normal. Judgment and thought content normal.     Assessment/Plan Or for laminectomy. BP 172/75 mmHg  Pulse 54  Temp(Src) 97.6 F (36.4 C) (Oral)  Resp 20  Ht _1  (1.854 m)  Wt 104.327 kg (230 lb)  BMI 30.35 kg/m2  SpO2 100% Andrew ZAWISTOWSKI Sr. has decided to undergo a lumbar discetomy/decompression for an hnp at levels L4/5. Risks and benefits including but not limited to bleeding, infection, paralysis, weakness in one or both extremities, bowel and/or bladder dysfunction, need for further surgery, no relief of pain. Andrew Balls Sr. understands and wishes to proceed.  Andrew Vasquez L 01/09/2015, 1:29 PM

## 2015-01-10 DIAGNOSIS — M5126 Other intervertebral disc displacement, lumbar region: Secondary | ICD-10-CM | POA: Diagnosis not present

## 2015-01-10 MED ORDER — CYCLOBENZAPRINE HCL 10 MG PO TABS
10.0000 mg | ORAL_TABLET | Freq: Three times a day (TID) | ORAL | Status: DC | PRN
Start: 2015-01-10 — End: 2021-02-19

## 2015-01-10 NOTE — Discharge Instructions (Signed)
Lumbar Discectomy °Care After °A discectomy involves removal of discmaterial (the cartilage-like structures located between the bones of the back). It is done to relieve pressure on nerve roots. It can be used as a treatment for a back problem. The time in surgery depends on the findings in surgery and what is necessary to correct the problems. °HOME CARE INSTRUCTIONS  °· Check the cut (incision) made by the surgeon twice a day for signs of infection. Some signs of infection may include:  °· A foul smelling, greenish or yellowish discharge from the wound.  °· Increased pain.  °· Increased redness over the incision (operative) site.  °· The skin edges may separate.  °· Flu-like symptoms (problems).  °· A temperature above 101.5° F (38.6° C).  °· Change your bandages in about 24 to 36 hours following surgery or as directed.  °· You may shower tomrrow.  Avoid bathtubs, swimming pools and hot tubs for three weeks or until your incision has healed completely. °· Follow your doctor's instructions as to safe activities, exercises, and physical therapy.  °· Weight reduction may be beneficial if you are overweight.  °· Daily exercise is helpful to prevent the return of problems. Walking is permitted. You may use a treadmill without an incline. Cut down on activities and exercise if you have discomfort. You may also go up and down stairs as much as you can tolerate.  °· DO NOT lift anything heavier than 10 to 15 lbs. Avoid bending or twisting at the waist. Always bend your knees when lifting.  °· Maintain strength and range of motion as instructed.  °· Do not drive for 10 days, or as directed by your doctors. You may be a passenger . Lying back in the passenger seat may be more comfortable for you. Always wear a seatbelt.  °· Limit your sitting in a regular chair to 20 to 30 minutes at a time. There are no limitations for sitting in a recliner. You should lie down or walk in between sitting periods.  °· Only take  over-the-counter or prescription medicines for pain, discomfort, or fever as directed by your caregiver.  °SEEK MEDICAL CARE IF:  °· There is increased bleeding (more than a small spot) from the wound.  °· You notice redness, swelling, or increasing pain in the wound.  °· Pus is coming from wound.  °· You develop an unexplained oral temperature above 102° F (38.9° C) develops.  °· You notice a foul smell coming from the wound or dressing.  °· You have increasing pain in your wound.  °SEEK IMMEDIATE MEDICAL CARE IF:  °· You develop a rash.  °· You have difficulty breathing.  °· You develop any allergic problems to medicines given.  °Document Released: 10/27/2004 Document Revised: 11/11/2011 Document Reviewed: 02/15/2008 °ExitCare® Patient Information °

## 2015-01-10 NOTE — Discharge Summary (Signed)
  Physician Discharge Summary  Patient ID: Andrew Vasquez Sr. MRN: [id number] DOB/AGE: 1946-09-30 69 y.o.  Admit date: 01/09/2015 Discharge date: 01/10/2015  Admission Diagnoses:Hnp L L4/5  Discharge Diagnoses:  Active Problems:   HNP (herniated nucleus pulposus), lumbar   Discharged Condition: good  Hospital Course: Mr. Quiles was admitted and taken to the operating room for an uncomplicated lumbar laminectomy and discetomy. Post op he is voiding, ambulating, and tolerating a regular diet. Her wound is clean, dry, and without signs of infection. He will be discharged to home.   Treatments: surgery: L4/5 semihemilaminectomy and discetomy  Discharge Exam: Blood pressure 140/63, pulse 64, temperature 98.2 F (36.8 C), temperature source Oral, resp. rate 18, height 6\' 1"  (1.854 m), weight 104.327 kg (230 lb), SpO2 99 %. General appearance: alert, cooperative, appears stated age and no distress Neurologic: Alert and oriented X 3, normal strength and tone. Normal symmetric reflexes. Normal coordination and gait  Disposition: 01-Home or Self Care lumbar herniated disc    Medication List    TAKE these medications        cyclobenzaprine 10 MG tablet  Commonly known as:  FLEXERIL  Take 1 tablet (10 mg total) by mouth 3 (three) times daily as needed for muscle spasms.           Follow-up Information    Follow up with Traven Davids L, MD In 3 weeks.   Specialty:  Neurosurgery   Why:  call to make an appointment   Contact information:   [address] [place] [address] [phone]       Signed: Winfield Cunas 01/10/2015, 9:12 AM

## 2015-01-10 NOTE — Progress Notes (Signed)
Pt doing well. Pt and daughter given D/C instructions with Rx, verbal understanding was provided. Pt's incision is clean and dry with no sign of infection. Pt D/C'd home via wheelchair @ 1020 per MD order. Pt is stable @ D/C and has no other needs at this time. Holli Humbles, RN

## 2015-01-13 ENCOUNTER — Encounter (HOSPITAL_COMMUNITY): Payer: Self-pay | Admitting: Neurosurgery

## 2015-07-19 IMAGING — CR DG LUMBAR SPINE 2-3V
1 series · 1 of 1 positions shown · non-contrast
Comparison: CT abdomen and pelvis 08/02/2010 ; no prior lumbar
radiographs or MR for comparison.

CLINICAL DATA: L4-L5 laminectomy and microdiscectomy, herniated
lumbar intervertebral disc

EXAM:
LUMBAR SPINE - 2-3 VIEW

[lat]
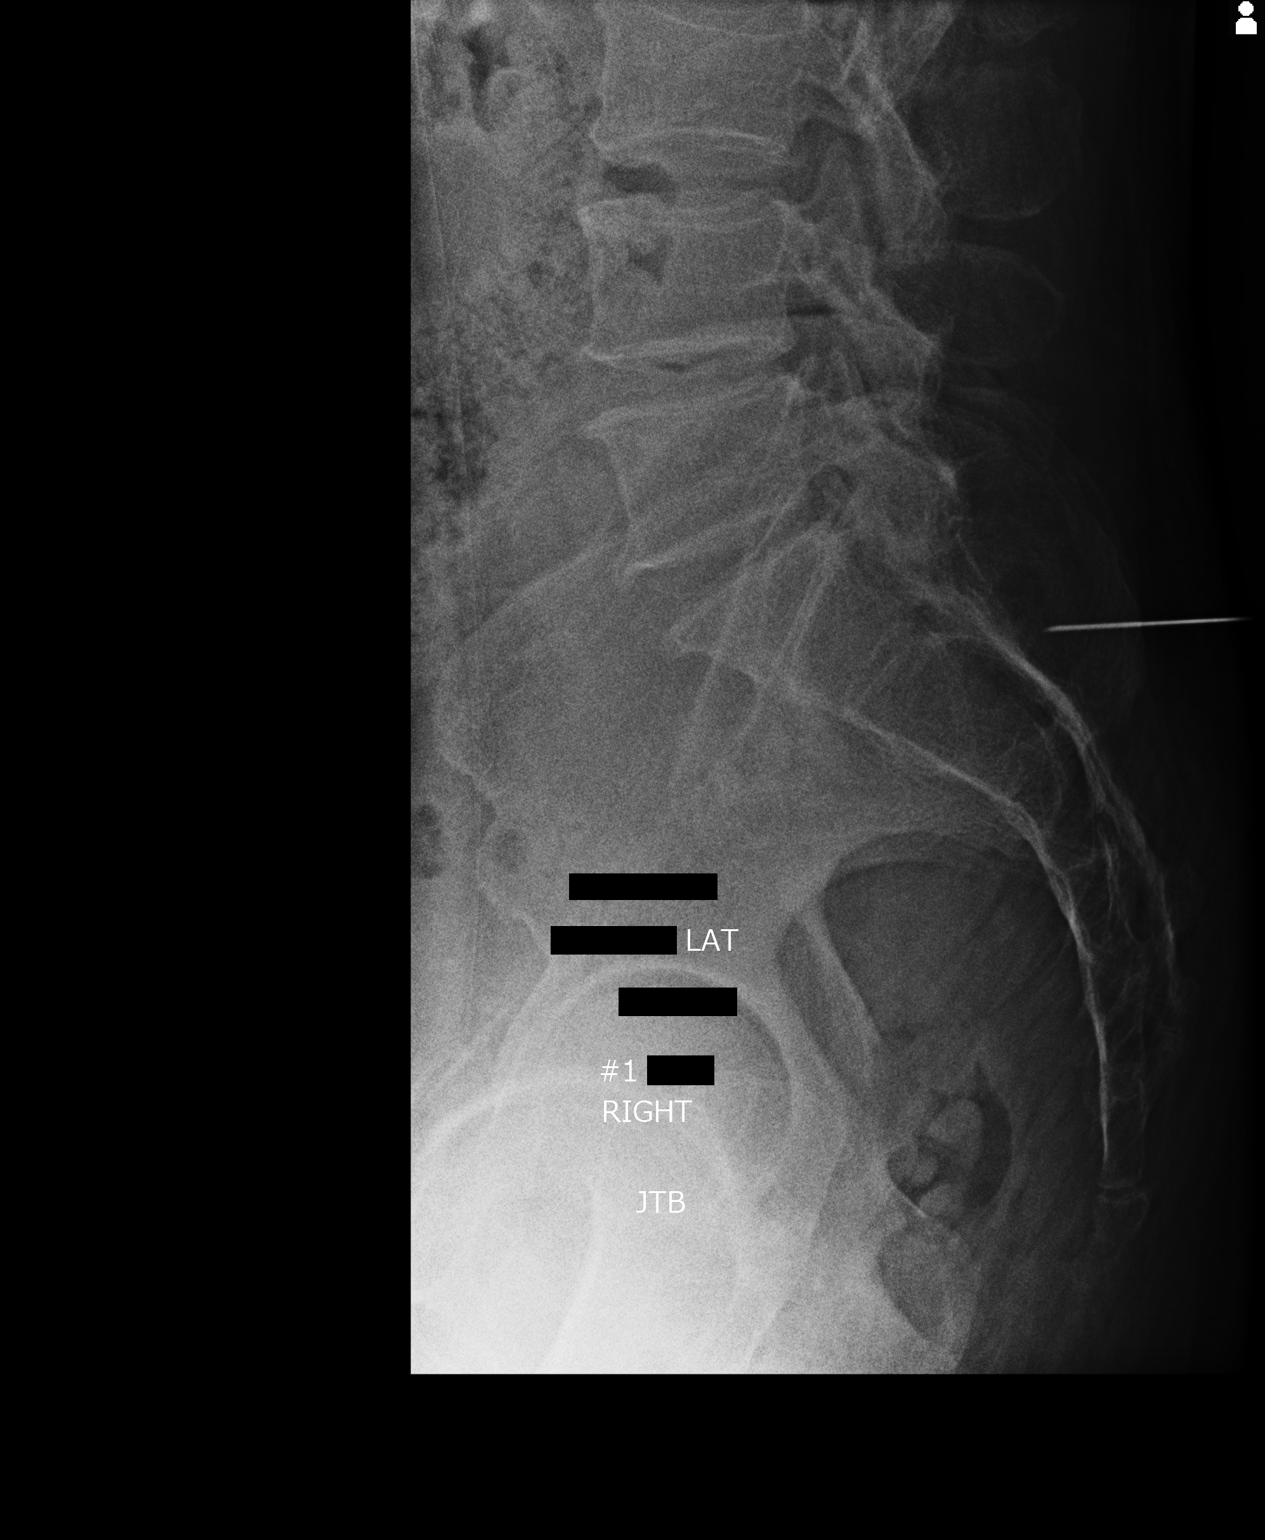

[1 of 1 positions shown; findings below may reference images not displayed]

FINDINGS: Exam presumptively labeled with 5 lumbar vertebrae.

There appear to be 5 lumbar vertebra present on prior CT abdomen and
pelvis.

Image #1 at 3313 hr: Metallic probe via posterior approach projects
dorsal to the upper sacrum at the S1-S2 junction.

Image #2 at 7931 hr: Motion artifacts degrade exam. Metallic probe
via dorsal approach projects dorsal to the L4-L5 disc space between
the spinous processes of L4 and L5.

Image #3 at 8983 hr: Metallic probe via dorsal approach projects
dorsal to the L4-L5 disc space.
IMPRESSION: Intraoperative posterior localization of the L4-L5 disc space level
as above.

## 2016-06-30 ENCOUNTER — Emergency Department
Admission: EM | Admit: 2016-06-30 | Discharge: 2016-06-30 | Disposition: A | Discharge status: Home / Self Care | Payer: Medicare Other | Attending: Emergency Medicine | Admitting: Emergency Medicine

## 2016-06-30 ENCOUNTER — Emergency Department: Payer: Medicare Other

## 2016-06-30 ENCOUNTER — Encounter: Payer: Self-pay | Admitting: Emergency Medicine

## 2016-06-30 DIAGNOSIS — N201 Calculus of ureter: Secondary | ICD-10-CM

## 2016-06-30 DIAGNOSIS — I10 Essential (primary) hypertension: Secondary | ICD-10-CM | POA: Diagnosis not present

## 2016-06-30 DIAGNOSIS — M1990 Unspecified osteoarthritis, unspecified site: Secondary | ICD-10-CM | POA: Diagnosis not present

## 2016-06-30 DIAGNOSIS — Z87891 Personal history of nicotine dependence: Secondary | ICD-10-CM | POA: Insufficient documentation

## 2016-06-30 DIAGNOSIS — F329 Major depressive disorder, single episode, unspecified: Secondary | ICD-10-CM | POA: Diagnosis not present

## 2016-06-30 DIAGNOSIS — R1031 Right lower quadrant pain: Secondary | ICD-10-CM | POA: Diagnosis present

## 2016-06-30 LAB — CBC WITH DIFFERENTIAL/PLATELET
Basophils Absolute: 0.1 K/uL (ref 0–0.1)
Basophils Relative: 1 %
Eosinophils Absolute: 0.1 K/uL (ref 0–0.7)
Eosinophils Relative: 2 %
HCT: 47.4 % (ref 40.0–52.0)
Hemoglobin: 16.2 g/dL (ref 13.0–18.0)
Lymphocytes Relative: 19 %
Lymphs Abs: 1.6 K/uL (ref 1.0–3.6)
MCH: 29.4 pg (ref 26.0–34.0)
MCHC: 34.1 g/dL (ref 32.0–36.0)
MCV: 86.1 fL (ref 80.0–100.0)
Monocytes Absolute: 0.6 K/uL (ref 0.2–1.0)
Monocytes Relative: 7 %
Neutro Abs: 6.3 K/uL (ref 1.4–6.5)
Neutrophils Relative %: 71 %
Platelets: 125 K/uL — ABNORMAL LOW (ref 150–440)
RBC: 5.51 MIL/uL (ref 4.40–5.90)
RDW: 13.7 % (ref 11.5–14.5)
WBC: 8.7 K/uL (ref 3.8–10.6)

## 2016-06-30 LAB — COMPREHENSIVE METABOLIC PANEL
ALT: 27 U/L (ref 17–63)
AST: 24 U/L (ref 15–41)
Albumin: 4.5 g/dL (ref 3.5–5.0)
Alkaline Phosphatase: 75 U/L (ref 38–126)
Anion gap: 9 (ref 5–15)
BILIRUBIN TOTAL: 0.6 mg/dL (ref 0.3–1.2)
BUN: 17 mg/dL (ref 6–20)
CHLORIDE: 102 mmol/L (ref 101–111)
CO2: 28 mmol/L (ref 22–32)
CREATININE: 1 mg/dL (ref 0.61–1.24)
Calcium: 9 mg/dL (ref 8.9–10.3)
GFR calc non Af Amer: >60 mL/min (ref >60)
Glucose, Bld: 120 mg/dL — ABNORMAL HIGH (ref 65–99)
Potassium: 4 mmol/L (ref 3.5–5.1)
Sodium: 139 mmol/L (ref 135–145)
TOTAL PROTEIN: 7.5 g/dL (ref 6.5–8.1)

## 2016-06-30 LAB — URINALYSIS COMPLETE WITH MICROSCOPIC (ARMC ONLY)
Bacteria, UA: NONE SEEN
Bilirubin Urine: NEGATIVE
Glucose, UA: NEGATIVE mg/dL
Ketones, ur: NEGATIVE mg/dL
Leukocytes, UA: NEGATIVE
Nitrite: NEGATIVE
Protein, ur: NEGATIVE mg/dL
Specific Gravity, Urine: 1.024 (ref 1.005–1.030)
Squamous Epithelial / HPF: NONE SEEN
pH: 5 (ref 5.0–8.0)

## 2016-06-30 LAB — TROPONIN I: Troponin I: <0.03 ng/mL

## 2016-06-30 LAB — LIPASE, BLOOD: LIPASE: 36 U/L (ref 11–51)

## 2016-06-30 MED ORDER — SODIUM CHLORIDE 0.9 % IV BOLUS (SEPSIS)
1000.0000 mL | Freq: Once | INTRAVENOUS | Status: AC
  Administered 2016-06-30: 1000 mL via INTRAVENOUS

## 2016-06-30 MED ORDER — HYDROMORPHONE HCL 1 MG/ML IJ SOLN
INTRAMUSCULAR | Status: AC
  Administered 2016-06-30: 1 mg via INTRAVENOUS
  Filled 2016-06-30: qty 1

## 2016-06-30 MED ORDER — IOPAMIDOL (ISOVUE-300) INJECTION 61%
100.0000 mL | Freq: Once | INTRAVENOUS | Status: AC | PRN
  Administered 2016-06-30: 100 mL via INTRAVENOUS

## 2016-06-30 MED ORDER — MORPHINE SULFATE (PF) 4 MG/ML IV SOLN
4.0000 mg | Freq: Once | INTRAVENOUS | Status: AC
Start: 2016-06-30 — End: 2016-06-30
  Administered 2016-06-30: 4 mg via INTRAVENOUS
  Filled 2016-06-30: qty 1

## 2016-06-30 MED ORDER — OXYCODONE-ACETAMINOPHEN 5-325 MG PO TABS: 1.0000 | ORAL_TABLET | Freq: Four times a day (QID) | ORAL | 0 refills | Status: AC | PRN

## 2016-06-30 MED ORDER — DIATRIZOATE MEGLUMINE & SODIUM 66-10 % PO SOLN
15.0000 mL | Freq: Once | ORAL | Status: AC
  Administered 2016-06-30: 15 mL via ORAL

## 2016-06-30 MED ORDER — ONDANSETRON HCL 4 MG/2ML IJ SOLN
4.0000 mg | Freq: Once | INTRAMUSCULAR | Status: AC
  Administered 2016-06-30: 4 mg via INTRAVENOUS
  Filled 2016-06-30: qty 2

## 2016-06-30 MED ORDER — HYDROMORPHONE HCL 1 MG/ML IJ SOLN
1.0000 mg | INTRAMUSCULAR | Status: AC
  Administered 2016-06-30: 1 mg via INTRAVENOUS

## 2016-06-30 NOTE — ED Notes (Signed)
Patient transported to CT 

## 2016-06-30 NOTE — ED Triage Notes (Signed)
Acute onset RLQ abdominal pain with vomit X 1.

## 2016-06-30 NOTE — ED Provider Notes (Signed)
[facility] Emergency Department Provider Note  Time seen: 11:52 AM  I have reviewed the triage vital signs and the nursing notes.   HISTORY  Chief Complaint Abdominal Pain    HPI Andrew Vasquez. is a 70 y.o. male with a past medical history of kidney stones, arthritis, back pain, hypertension, who presents the emergency department with acute onset of right lower quadrant abdominal pain this morning. According to the patient beginning this morning he has developed moderate progressively worsening right lower quadrant abdominal pain. Currently 8/10. Patient was able to eat 2 hours ago, denies any change in his pain with eating. Denies dysuria or hematuria. States one episode of loose stool yesterday, but has been constipated for approximately one week. One episode of vomiting this morning. Describes the pain as dull aching pain in his right lower quadrant. Has a history of kidney stones but states this does not feel like his typical kidney stone.     Past Medical History:  Diagnosis Date  . Arthritis   . Back pain   . Depression   . Hypertension    doen not tke meds for htn at this time  . Kidney stone   . Knee pain     Patient Active Problem List   Diagnosis Date Noted  . HNP (herniated nucleus pulposus), lumbar 01/09/2015    Past Surgical History:  Procedure Laterality Date  . COLONOSCOPY    . KNEE SURGERY    . LUMBAR LAMINECTOMY/DECOMPRESSION MICRODISCECTOMY Left 01/09/2015   Procedure: LUMBAR FOUR-FIVE LAMINECTOMY/DECOMPRESSION MICRODISCECTOMY 1 LEVEL;  Surgeon: Ashok Pall, MD;  Location: [facility];  Service: Neurosurgery;  Laterality: Left;  Left L45 microdiskectomy  . VASECTOMY      Current Outpatient Rx  . Order #: [id number] Class: Print    Allergies Sulfa antibiotics  History reviewed. No pertinent family history.  Social History Social History  Substance Use Topics  . Smoking status: Former Research scientist (life sciences)  . Smokeless tobacco: Not  on file  . Alcohol use No    Review of Systems Constitutional: Negative for fever. Cardiovascular: Negative for chest pain. Respiratory: Negative for shortness of breath. Gastrointestinal:Right lower quadrant pain. Positive for nausea and vomiting 1. Genitourinary: Negative for dysuria. Negative for hematuria. Musculoskeletal: Negative for back pain. Neurological: Negative for headache 10-point ROS otherwise negative.  ____________________________________________   PHYSICAL EXAM:  VITAL SIGNS: ED Triage Vitals [06/30/16 1126]  Enc Vitals Group     BP (!) 195/94     Pulse Rate (!) 54     Resp (!) 24     Temp 97.9 F (36.6 C)     Temp Source Oral     SpO2 98 %     Weight 230 lb (104.3 kg)     Height 6' (1.829 m)     Head Circumference      Peak Flow      Pain Score 8     Pain Loc      Pain Edu?      Excl. in [place]?     Constitutional: Alert and oriented. Well appearing and in no distress. Eyes: Normal exam ENT   Head: Normocephalic and atraumatic   Mouth/Throat: Mucous membranes are moist. Cardiovascular: Normal rate, regular rhythm. No murmur Respiratory: Normal respiratory effort without tachypnea nor retractions. Breath sounds are clear Gastrointestinal: Soft, moderate right mid abdomen to right lower quadrant abdominal tenderness to palpation. No rebound or guarding. No distention. Musculoskeletal: Nontender with normal range of motion in all extremities.  Neurologic:  Normal speech and language. No gross focal neurologic deficits  Skin:  Skin is warm, dry and intact.  Psychiatric: Mood and affect are normal.   ____________________________________________      RADIOLOGY  CT scan shows distal right ureteral stone  ____________________________________________   INITIAL IMPRESSION / ASSESSMENT AND PLAN / ED COURSE  Pertinent labs & imaging results that were available during my care of the patient were reviewed by me and considered in my medical  decision making (see chart for details).  The patient presents the emergency department with right lower quadrant abdominal pain. Pain has been progressively worsening since this morning. Denies fever. Moderate tenderness in the right mid to lower abdomen, possible appendicitis versus colitis versus SBO versus cholecystitis. We will check labs as well as an abdominal CT scan.  Patient's labs are largely within normal limits besides a mild amount of red blood cells in his urine. Possible ureterolithiasis. Currently pending CT abdomen/pelvis.  CT consistent with distal punctate ureteral stone on the right side. Labs are within normal limits besides a mild amount of blood in his urine again consistent with ureterolithiasis. We'll discharge with pain medication. I discussed return precautions for worsening pain or fever. ____________________________________________   FINAL CLINICAL IMPRESSION(S) / ED DIAGNOSES  Abdominal pain Ureterolithiasis   Harvest Dark, MD 06/30/16 [phone]

## 2016-07-28 DIAGNOSIS — I10 Essential (primary) hypertension: Secondary | ICD-10-CM | POA: Insufficient documentation

## 2017-01-07 IMAGING — CT CT ABD-PELV W/ CM
2 of 5 series · 16 of 46 positions shown, 18 images · IV contrast (iopamidol)
Comparison: 08/02/2010

CLINICAL DATA: Right lower quadrant pain, vomiting

EXAM:
CT ABDOMEN AND PELVIS WITH CONTRAST
TECHNIQUE: Multidetector CT imaging of the abdomen and pelvis was performed
using the standard protocol following bolus administration of
intravenous contrast.
CONTRAST:  100mL SQZMGJ-8LL IOPAMIDOL (SQZMGJ-8LL) INJECTION 61%

[Series 2: axial st · axial · 0.88mm/px · z∈[-1004,-579]mm · 13 of 97 slices shown, 15 images]
[im 6/97  soft-tissue]
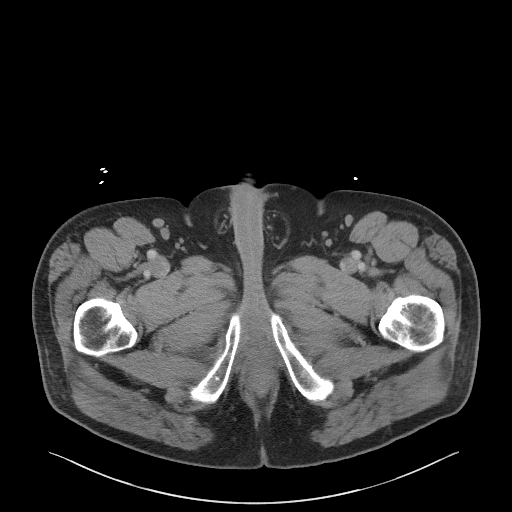
[im 6/97  bone]
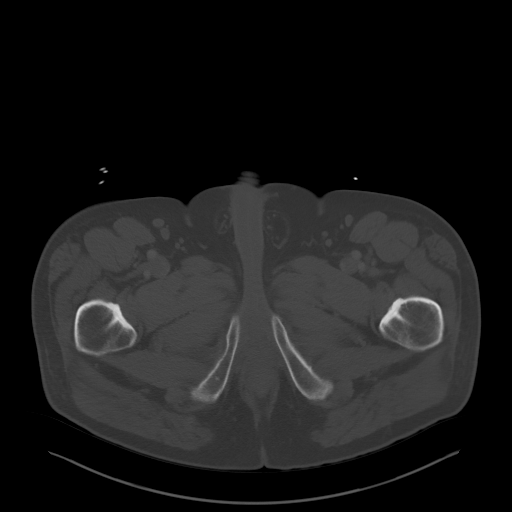
[im 16/97  soft-tissue]
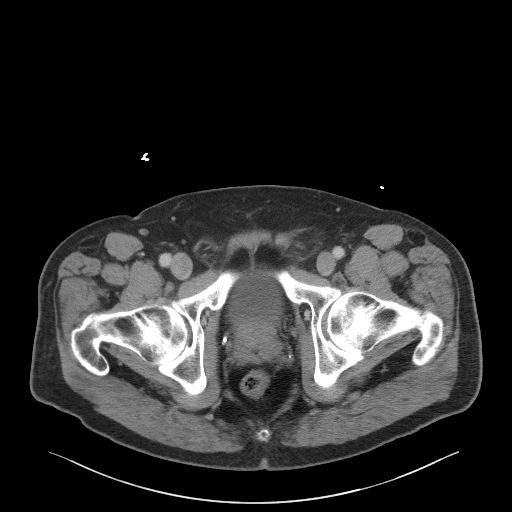
[im 21/97  soft-tissue]
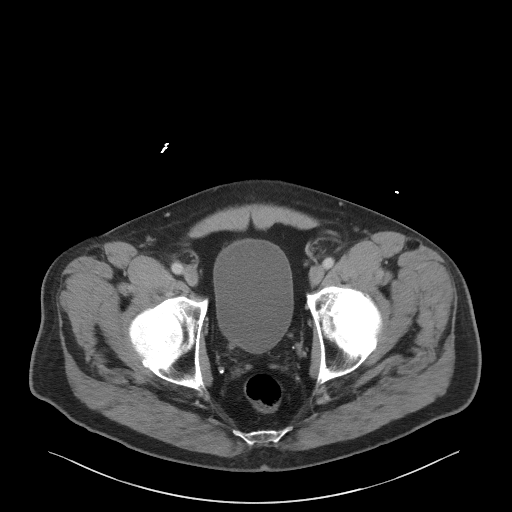
[im 26/97  soft-tissue]
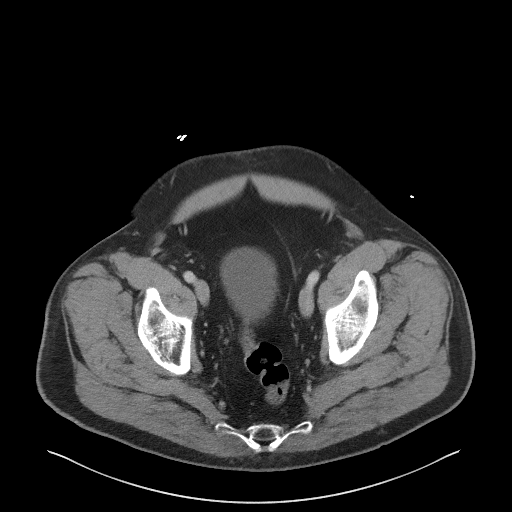
[im 36/97  soft-tissue]
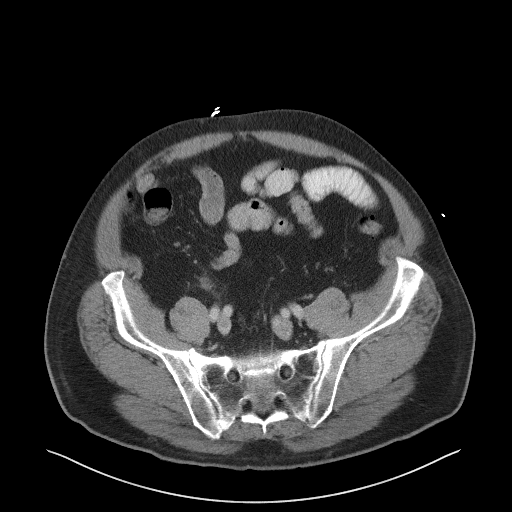
[im 41/97  soft-tissue]
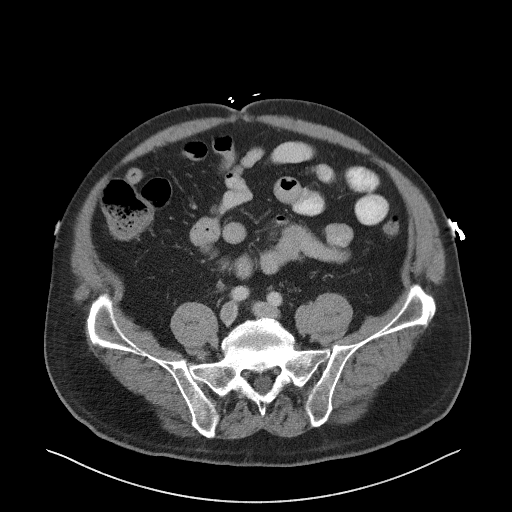
[im 51/97  soft-tissue]
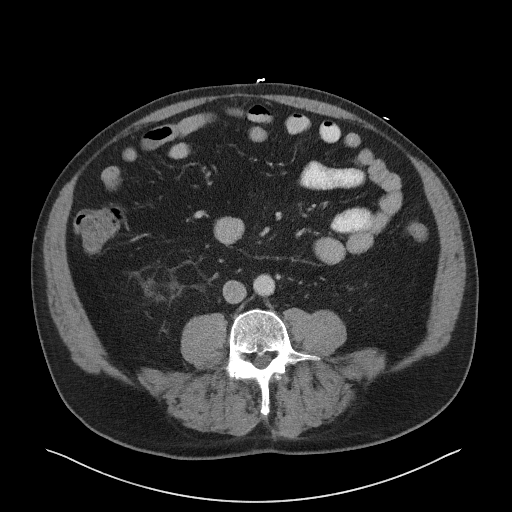
[im 56/97  soft-tissue]
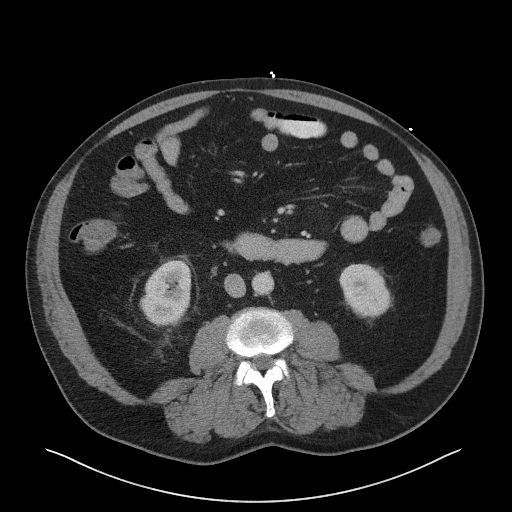
[im 61/97  soft-tissue]
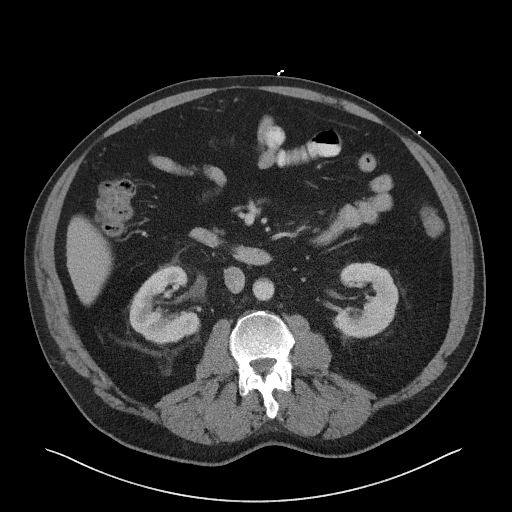
[im 61/97  bone]
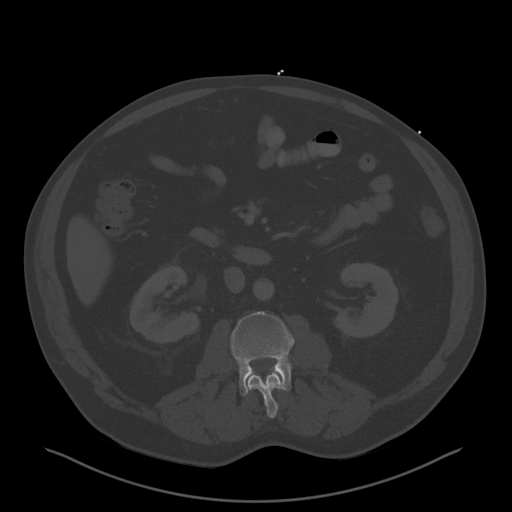
[im 71/97  soft-tissue]
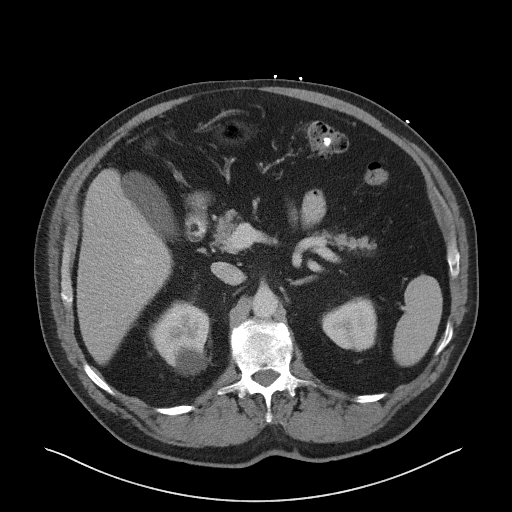
[im 76/97  soft-tissue]
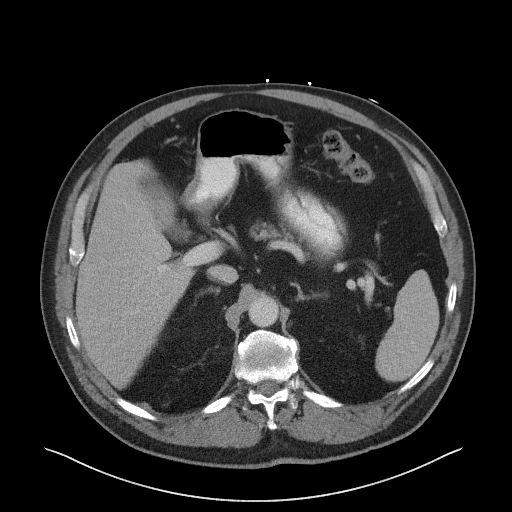
[im 81/97  soft-tissue]
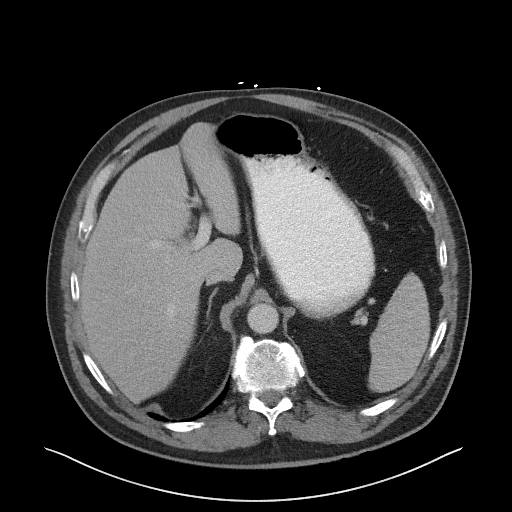
[im 91/97  soft-tissue]
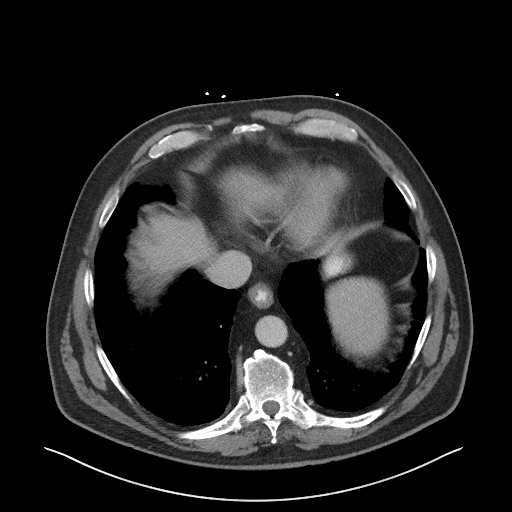

[Series 5: coronal st · coronal · 0.86mm/px · 3 of 112 slices shown]
[im 38/112  soft-tissue]
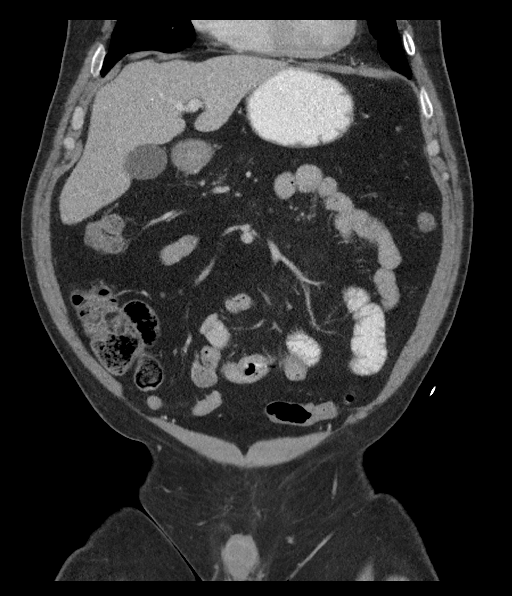
[im 50/112  soft-tissue]
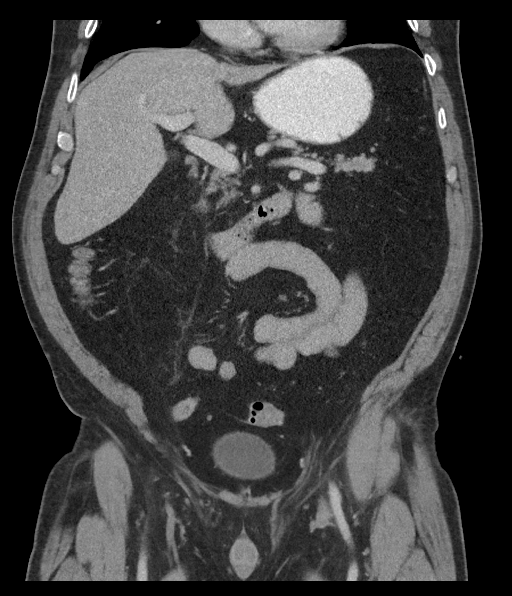
[im 62/112  soft-tissue]
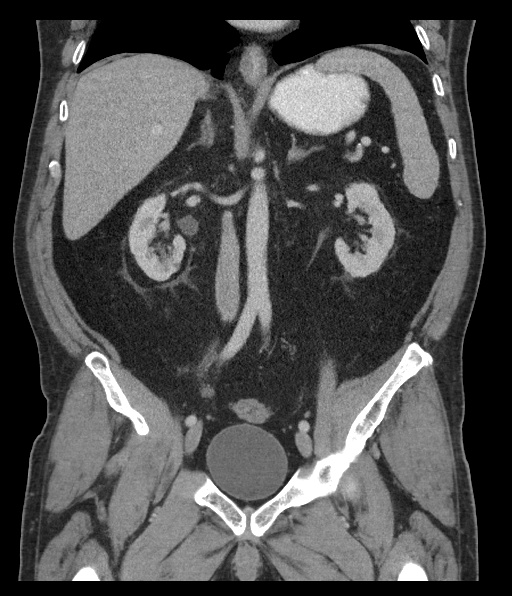

[16 of 46 positions shown; findings below may reference images not displayed]

FINDINGS: Lower chest: Mild dependent atelectasis in the bilateral lung bases.

Hepatobiliary: Liver is within normal limits. No
suspicious/enhancing hepatic lesions.

Gallbladder is unremarkable. No intrahepatic or extrahepatic ductal
dilatation.

Pancreas: Within normal limits.

Spleen: Within normal limits.

Adrenals/Urinary Tract: Adrenal glands are within normal limits.

3.6 cm cyst along the posterior right upper kidney (series 2/ image
29). Mild cortical scarring along the anterior right lower kidney
(series 2/ image 39).

Left kidney is within normal limits.

Punctate distal right ureteral calculus at the UVJ (series 2/image
39). No hydronephrosis.

Bladder is within normal limits.

Stomach/Bowel: S stomach is within normal limits.

No evidence of bowel obstruction.

Normal appendix (series 2/image 59).

Sigmoid diverticulosis, without evidence of diverticulitis.

Vascular/Lymphatic: No evidence of abdominal aortic aneurysm.

No suspicious abdominopelvic lymphadenopathy.

Reproductive: Prostate is notable for dystrophic calcifications.

Other: No abdominopelvic ascites.

Fat within the bilateral inguinal canals.

Musculoskeletal: Degenerative changes of the visualized
thoracolumbar spine.
IMPRESSION: Punctate distal right ureteral calculus at the UVJ. No
hydronephrosis.

No evidence of bowel obstruction.  Normal appendix.

Additional ancillary findings as above.

## 2020-12-18 ENCOUNTER — Encounter: Payer: Self-pay | Admitting: Dermatology

## 2020-12-18 ENCOUNTER — Other Ambulatory Visit: Payer: Self-pay

## 2020-12-18 ENCOUNTER — Ambulatory Visit (INDEPENDENT_AMBULATORY_CARE_PROVIDER_SITE_OTHER): Payer: Medicare Other | Admitting: Dermatology

## 2020-12-18 DIAGNOSIS — L814 Other melanin hyperpigmentation: Secondary | ICD-10-CM

## 2020-12-18 DIAGNOSIS — L280 Lichen simplex chronicus: Secondary | ICD-10-CM

## 2020-12-18 DIAGNOSIS — Z86018 Personal history of other benign neoplasm: Secondary | ICD-10-CM

## 2020-12-18 DIAGNOSIS — D485 Neoplasm of uncertain behavior of skin: Secondary | ICD-10-CM

## 2020-12-18 DIAGNOSIS — L821 Other seborrheic keratosis: Secondary | ICD-10-CM | POA: Diagnosis not present

## 2020-12-18 DIAGNOSIS — D225 Melanocytic nevi of trunk: Secondary | ICD-10-CM

## 2020-12-18 HISTORY — DX: Personal history of other benign neoplasm: Z86.018

## 2020-12-18 MED ORDER — TRIAMCINOLONE ACETONIDE 0.1 % EX OINT: TOPICAL_OINTMENT | CUTANEOUS | 1 refills | Status: DC

## 2020-12-18 NOTE — Progress Notes (Signed)
New Patient Visit  Subjective  Andrew Vasquez. is a 75 y.o. male who presents for the following: Rash (New patient here today for rash/spots at arms, abdomen, legs. Present off and on for maybe 10 years per patient. The spots do itch and get irritated. Patient was given mupirocin by Dr. Doy Hutching to use on areas. He also has a spot at back that is itchy. No history of skin cancer. ).  The following portions of the chart were reviewed this encounter and updated as appropriate:   Allergies  Meds  Problems  Med Hx  Surg Hx  Fam Hx      Review of Systems:  No other skin or systemic complaints except as noted in HPI or Assessment and Plan.  Objective  Well appearing patient in no apparent distress; mood and affect are within normal limits.  A focused examination was performed including face, neck, chest and back and arms. Relevant physical exam findings are noted in the Assessment and Plan.  Objective  Left Forearm: 1.0cm scaly pink nodule      Objective  Left Upper Back: 0.4cm irregular medium and dark brown thin papule      Objective  arms, chest, abdomen, posterior neck, back: Multiple excoriated lichenified plaques and nodules   Assessment & Plan  Neoplasm of uncertain behavior of skin (2) Left Forearm  Skin / nail biopsy Type of biopsy: tangential   Informed consent: discussed and consent obtained   Timeout: patient name, date of birth, surgical site, and procedure verified   Patient was prepped and draped in usual sterile fashion: Area prepped with isopropyl alcohol. Anesthesia: the lesion was anesthetized in a standard fashion   Anesthetic:  1% lidocaine w/ epinephrine 1-100,000 buffered w/ 8.4% NaHCO3 Instrument used: flexible razor blade   Hemostasis achieved with: aluminum chloride   Outcome: patient tolerated procedure well   Post-procedure details: wound care instructions given   Additional details:  Mupirocin and a bandage applied  Specimen 1 -  Surgical pathology Differential Diagnosis: R/o Prurigo Nodule vs SCC  Check Margins: No 1.0cm scaly pink nodule  Left Upper Back  Epidermal / dermal shaving  Lesion diameter (cm):  0.4 Informed consent: discussed and consent obtained   Timeout: patient name, date of birth, surgical site, and procedure verified   Patient was prepped and draped in usual sterile fashion: area prepped with isopropyl alcohol. Anesthesia: the lesion was anesthetized in a standard fashion   Anesthetic:  1% lidocaine w/ epinephrine 1-100,000 buffered w/ 8.4% NaHCO3 Instrument used: flexible razor blade   Hemostasis achieved with: aluminum chloride   Outcome: patient tolerated procedure well   Post-procedure details: wound care instructions given   Additional details:  Mupirocin and a bandage applied  Specimen 2 - Surgical pathology Differential Diagnosis: R/o Atypia  Check Margins: No 0.4cm irregular medium and dark brown thin papule  Lichen simplex chronicus arms, chest, abdomen, posterior neck, back  With prurigo nodularis  Discussed steroid injections if not improving with topical treatments.   Chronic condition with duration over one year. Condition is bothersome to patient. Currently flared.  Start TMC 0.1% ointment once daily to affected areas and cover with bandaid. Avoid applying to face, groin, and axilla. Use as directed. Risk of skin atrophy with long-term use reviewed.   Topical steroids (such as triamcinolone, fluocinolone, fluocinonide, mometasone, clobetasol, halobetasol, betamethasone, hydrocortisone) can cause thinning and lightening of the skin if they are used for too long in the same area. Your physician has  selected the right strength medicine for your problem and area affected on the body. Please use your medication only as directed by your physician to prevent side effects.   Avoid scratching, picking, squeezing, etc.  Ordered Medications: triamcinolone ointment (KENALOG)  0.1 %   Seborrheic Keratoses - Stuck-on, waxy, tan-brown papules and plaques  - Discussed benign etiology and prognosis. - Observe - Call for any changes  Lentigines - Scattered tan macules - Discussed due to sun exposure - Benign, observe - Recommend daily broad spectrum sunscreen SPF 30+ to sun-exposed areas, reapply every 2 hours as needed. - Call for any changes - Plan FBSE in future  Return in about 4 weeks (around 01/15/2021).  Graciella Belton, RMA, am acting as scribe for Forest Gleason, MD .  Documentation: I have reviewed the above documentation for accuracy and completeness, and I agree with the above.  Forest Gleason, MD

## 2020-12-18 NOTE — Patient Instructions (Addendum)
Wound Care Instructions  1. Cleanse wound gently with soap and water once a day then pat dry with clean gauze. Apply a thing coat of Petrolatum (petroleum jelly, "Vaseline") over the wound (unless you have an allergy to this). We recommend that you use a new, sterile tube of Vaseline. Do not pick or remove scabs. Do not remove the yellow or white "healing tissue" from the base of the wound.  2. Cover the wound with fresh, clean, nonstick gauze and secure with paper tape. You may use Band-Aids in place of gauze and tape if the would is small enough, but would recommend trimming much of the tape off as there is often too much. Sometimes Band-Aids can irritate the skin.  3. You should call the office for your biopsy report after 1 week if you have not already been contacted.  4. If you experience any problems, such as abnormal amounts of bleeding, swelling, significant bruising, significant pain, or evidence of infection, please call the office immediately.  Topical steroids (such as triamcinolone, fluocinolone, fluocinonide, mometasone, clobetasol, halobetasol, betamethasone, hydrocortisone) can cause thinning and lightening of the skin if they are used for too long in the same area. Your physician has selected the right strength medicine for your problem and area affected on the body. Please use your medication only as directed by your physician to prevent side effects.    Recommend OTC Gold Bond Rapid Relief Anti-Itch cream (pramoxine + menthol) up to 3 times per day to areas that are itchy.

## 2020-12-23 NOTE — Progress Notes (Signed)
1. Skin , left forearm VERRUCA VULGARIS  This is a WART caused by the human papilloma virus. It is not dangerous but is contagious and can spread to other areas of skin or other people if it is not completely gone. No additional treatment is needed. However, if it comes back, we can freeze it in clinic with liquid nitrogen (a quick in office procedure) or you can also treat it at home with an over the counter salicylic wart treatment (slower).  Please call the office at [phone] or message [place] if you have have any questions.   2. 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 On the spectrum from normal mole to melanoma skin cancer, this is in between but it is much closer to a normal mole.  - These typically do not progress to melanoma or cause any trouble.  - People who have a history of atypical moles do have a slightly increased risk of developing melanoma somewhere on the body, so a yearly full body skin exam by a dermatologist is recommended.  - Monthly self skin checks and daily sun protection are also recommended.  - Please call if you notice a dark spot coming back where this biopsy was taken.  - Please also call if you notice any new or changing spots anywhere else on the body before your follow-up visit.  - Please call our office or send [place] a message if you have any questions or concerns about this biopsy result.    MAs please call and go over this and also schedule for 1 year FBSE. Thank you!

## 2020-12-24 ENCOUNTER — Telehealth: Payer: Self-pay

## 2020-12-24 NOTE — Telephone Encounter (Signed)
-----   Message from Alfonso Patten, MD sent at 12/23/2020  2:13 PM EST ----- 1. Skin , left forearm VERRUCA VULGARIS  This is a WART caused by the human papilloma virus. It is not dangerous but is contagious and can spread to other areas of skin or other people if it is not completely gone. No additional treatment is needed. However, if it comes back, we can freeze it in clinic with liquid nitrogen (a quick in office procedure) or you can also treat it at home with an over the counter salicylic wart treatment (slower).  Please call the office at [phone] or message [place] if you have have any questions.   2. 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 On the spectrum from normal mole to melanoma skin cancer, this is in between but it is much closer to a normal mole.  - These typically do not progress to melanoma or cause any trouble.  - People who have a history of atypical moles do have a slightly increased risk of developing melanoma somewhere on the body, so a yearly full body skin exam by a dermatologist is recommended.  - Monthly self skin checks and daily sun protection are also recommended.  - Please call if you notice a dark spot coming back where this biopsy was taken.  - Please also call if you notice any new or changing spots anywhere else on the body before your follow-up visit.  - Please call our office or send [place] a message if you have any questions or concerns about this biopsy result.    MAs please call and go over this and also schedule for 1 year FBSE. Thank you!

## 2020-12-24 NOTE — Telephone Encounter (Signed)
Tried calling patient with no answer. Patient voice mail is not set up and unable to leave message. Will try again at later time.

## 2021-01-13 ENCOUNTER — Other Ambulatory Visit: Payer: Self-pay

## 2021-01-13 ENCOUNTER — Encounter: Payer: Self-pay | Admitting: Dermatology

## 2021-01-13 ENCOUNTER — Ambulatory Visit: Payer: Medicare Other | Admitting: Dermatology

## 2021-01-13 DIAGNOSIS — B078 Other viral warts: Secondary | ICD-10-CM

## 2021-01-13 DIAGNOSIS — Z86018 Personal history of other benign neoplasm: Secondary | ICD-10-CM

## 2021-01-13 DIAGNOSIS — D369 Benign neoplasm, unspecified site: Secondary | ICD-10-CM

## 2021-01-13 DIAGNOSIS — D234 Other benign neoplasm of skin of scalp and neck: Secondary | ICD-10-CM

## 2021-01-13 DIAGNOSIS — D2361 Other benign neoplasm of skin of right upper limb, including shoulder: Secondary | ICD-10-CM

## 2021-01-13 DIAGNOSIS — D2362 Other benign neoplasm of skin of left upper limb, including shoulder: Secondary | ICD-10-CM

## 2021-01-13 DIAGNOSIS — L280 Lichen simplex chronicus: Secondary | ICD-10-CM

## 2021-01-13 NOTE — Progress Notes (Signed)
Follow-Up Visit   Subjective  Andrew Vasquez. is a 75 y.o. male who presents for the following: Follow-up (Patient here today for 4 week Lucky follow up. He is using [place] 0.1% and had IL injections at last visit. Areas at arms have improved but there are some areas at post neck that are irritated. ).  Biopsy results from previous appt reviewed with patient showing a verruca at left forearm and mildly dysplastic nevus at back.   The following portions of the chart were reviewed this encounter and updated as appropriate:   Allergies  Meds  Problems  Med Hx  Surg Hx  Fam Hx      Review of Systems:  No other skin or systemic complaints except as noted in HPI or Assessment and Plan.  Objective  Well appearing patient in no apparent distress; mood and affect are within normal limits.  A focused examination was performed including neck, arms. Relevant physical exam findings are noted in the Assessment and Plan.  Objective  Left Forearm: Verrucous papules  Objective  R forearm x 4, L forearm x 4, posterior neck x 7 (15): Multiple lichenified papules and plaques  Objective  Right Forearm - Anterior: Lichenified plaques   Assessment & Plan  Other viral warts Left Forearm  Biopsy proven  Discussed viral etiology and risk of spread.  Discussed multiple treatments may be required to clear warts.  Discussed possible post-treatment dyspigmentation and risk of recurrence.  Prior to procedure, discussed risks of blister formation, small wound, skin dyspigmentation, or rare scar following cryotherapy.    Destruction of lesion - Left Forearm Complexity: simple   Destruction method: cryotherapy   Informed consent: discussed and consent obtained   Lesion destroyed using liquid nitrogen: Yes   Cryotherapy cycles:  2 Outcome: patient tolerated procedure well with no complications   Post-procedure details: wound care instructions given    Benign neoplasm (15) R forearm x 4, L  forearm x 4, posterior neck x 7  Favor verrucae with LSC  Will treat with LN2 today. Once healed, can cont TMC 0.1% ointment as needed and cover with band aid to itchy areas. Avoid applying to face, groin, and axilla. Use as directed. Risk of skin atrophy with long-term use reviewed.   Topical steroids (such as triamcinolone, fluocinolone, fluocinonide, mometasone, clobetasol, halobetasol, betamethasone, hydrocortisone) can cause thinning and lightening of the skin if they are used for too long in the same area. Your physician has selected the right strength medicine for your problem and area affected on the body. Please use your medication only as directed by your physician to prevent side effects.    Destruction of lesion - R forearm x 4, L forearm x 4, posterior neck x 7 Complexity: simple   Destruction method: cryotherapy   Informed consent: discussed and consent obtained   Lesion destroyed using liquid nitrogen: Yes   Cryotherapy cycles:  2 Outcome: patient tolerated procedure well with no complications   Post-procedure details: wound care instructions given    Lichen simplex chronicus Right Forearm - Anterior  Continue Triamcinolone apply once daily and cover to itchy areas. Avoid picking, rubbing, or scratching.  Other Related Medications triamcinolone ointment (KENALOG) 0.1 %  History of Dysplastic Nevi - No evidence of recurrence today, mild at left upper back - Recommend regular full body skin exams - Recommend daily broad spectrum sunscreen SPF 30+ to sun-exposed areas, reapply every 2 hours as needed.  - Call if any new or changing  lesions are noted between office visits  Return in about 1 year (around 01/13/2022) for TBSE, 1 month LSC/verruca.  Graciella Belton, RMA, am acting as scribe for Forest Gleason, MD .  Documentation: I have reviewed the above documentation for accuracy and completeness, and I agree with the above.  Forest Gleason, MD

## 2021-01-13 NOTE — Patient Instructions (Addendum)
Cryotherapy Aftercare  . Wash gently with soap and water everyday.   Marland Kitchen Apply Vaseline and Band-Aid daily until healed.  Prior to procedure, discussed risks of blister formation, small wound, skin dyspigmentation, or rare scar following cryotherapy.    Topical steroids (such as triamcinolone, fluocinolone, fluocinonide, mometasone, clobetasol, halobetasol, betamethasone, hydrocortisone) can cause thinning and lightening of the skin if they are used for too long in the same area. Your physician has selected the right strength medicine for your problem and area affected on the body. Please use your medication only as directed by your physician to prevent side effects.

## 2021-02-19 ENCOUNTER — Other Ambulatory Visit: Payer: Self-pay

## 2021-02-19 ENCOUNTER — Ambulatory Visit: Payer: Medicare Other | Admitting: Dermatology

## 2021-02-19 DIAGNOSIS — S41102A Unspecified open wound of left upper arm, initial encounter: Secondary | ICD-10-CM | POA: Diagnosis not present

## 2021-02-19 DIAGNOSIS — T148XXA Other injury of unspecified body region, initial encounter: Secondary | ICD-10-CM

## 2021-02-19 DIAGNOSIS — L280 Lichen simplex chronicus: Secondary | ICD-10-CM

## 2021-02-19 DIAGNOSIS — D2361 Other benign neoplasm of skin of right upper limb, including shoulder: Secondary | ICD-10-CM

## 2021-02-19 DIAGNOSIS — D369 Benign neoplasm, unspecified site: Secondary | ICD-10-CM

## 2021-02-19 MED ORDER — MUPIROCIN 2 % EX OINT
1.0000 | TOPICAL_OINTMENT | Freq: Two times a day (BID) | CUTANEOUS | 1 refills | Status: DC
Start: 2021-02-19 — End: 2024-04-17

## 2021-02-19 NOTE — Patient Instructions (Addendum)
Topical steroids (such as triamcinolone, fluocinolone, fluocinonide, mometasone, clobetasol, halobetasol, betamethasone, hydrocortisone) can cause thinning and lightening of the skin if they are used for too long in the same area. Your physician has selected the right strength medicine for your problem and area affected on the body. Please use your medication only as directed by your physician to prevent side effects.  Avoid applying to face, groin, and axilla. Use as directed. Risk of skin atrophy with long-term use reviewed.   Cryotherapy Aftercare  . Wash gently with soap and water everyday.   Marland Kitchen Apply Vaseline and Band-Aid daily until healed.   If you have any questions or concerns for your doctor, please call our main line at [phone] and press option 4 to reach your doctor's medical assistant. If no one answers, please leave a voicemail as directed and we will return your call as soon as possible. Messages left after 4 pm will be answered the following business day.   You may also send [place] a message via [place]. We typically respond to MyChart messages within 1-2 business days.  For prescription refills, please ask your pharmacy to contact our office. Our fax number is [phone].  If you have an urgent issue when the clinic is closed that cannot wait until the next business day, you can page your doctor at the number below.    Please note that while we do our best to be available for urgent issues outside of office hours, we are not available 24/7.   If you have an urgent issue and are unable to reach [place], you may choose to seek medical care at your doctor's office, retail clinic, urgent care center, or emergency room.  If you have a medical emergency, please immediately call 911 or go to the emergency department.  Pager Numbers  - Dr. Nehemiah Massed: [phone]  - Dr. Laurence Ferrari: [phone]  - Dr. Nicole Kindred: [phone]  In the event of inclement weather, please call our main line  at [phone] for an update on the status of any delays or closures.  Dermatology Medication Tips: Please keep the boxes that topical medications come in in order to help keep track of the instructions about where and how to use these. Pharmacies typically print the medication instructions only on the boxes and not directly on the medication tubes.   If your medication is too expensive, please contact our office at [phone] option 4 or send [place] a message through [place].   We are unable to tell what your co-pay for medications will be in advance as this is different depending on your insurance coverage. However, we may be able to find a substitute medication at lower cost or fill out paperwork to get insurance to cover a needed medication.   If a prior authorization is required to get your medication covered by your insurance company, please allow [place] 1-2 business days to complete this process.  Drug prices often vary depending on where the prescription is filled and some pharmacies may offer cheaper prices.  The website www.goodrx.com contains coupons for medications through different pharmacies. The prices here do not account for what the cost may be with help from insurance (it may be cheaper with your insurance), but the website can give you the price if you did not use any insurance.  - You can print the associated coupon and take it with your prescription to the pharmacy.  - You may also stop by our office during regular business hours and pick up a  GoodRx coupon card.  - If you need your prescription sent electronically to a different pharmacy, notify our office through [facility] or by phone at [phone] option 4.

## 2021-02-19 NOTE — Progress Notes (Signed)
Follow-Up Visit   Subjective  Andrew Vasquez. is a 75 y.o. male who presents for the following: Warts (Patient here today for 1 month follow up on warts on b/l arms and posterior neck. He reports warts on neck are still bothering him. He feels arms have gotten better. 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 He is still using tmc 0.1 % cream ).  The following portions of the chart were reviewed this encounter and updated as appropriate:  Allergies  Meds  Problems  Med Hx  Surg Hx  Fam Hx       Objective  Well appearing patient in no apparent distress; mood and affect are within normal limits.  A focused examination was performed including neck, b/l arms. Relevant physical exam findings are noted in the Assessment and Plan.  Objective  bilateral arms: Lichenified plaques bilateral arms   Objective  Left Forearm - Anterior: Crusted open wound   Objective  right forearm x4, left forearm x 3, posterior neck x 6, right dorsal hand x 1 (14): Multiple lichenified papules and plaques   6 x posterior neck , right forearm x 4 right dorsal hand x 1 left forearm x 3  Assessment & Plan  Lichen simplex chronicus bilateral arms  Continue Triamcinolone 0.1 % ointment qd to aa  arms and cover with bandaide. Avoid applying to face, groin, and axilla. Use as directed. Risk of skin atrophy with long-term use reviewed.   Avoid picking, rubbing, or scratching.  Topical steroids (such as triamcinolone, fluocinolone, fluocinonide, mometasone, clobetasol, halobetasol, betamethasone, hydrocortisone) can cause thinning and lightening of the skin if they are used for too long in the same area. Your physician has selected the right strength medicine for your problem and area affected on the body. Please use your medication only as directed by your physician to prevent side effects.    Other Related Medications triamcinolone ointment (KENALOG) 0.1  %  Open wound Left Forearm - Anterior  Start mupiricion and bandaid daily until healed   Ordered Medications: mupirocin ointment (BACTROBAN) 2 %  Benign neoplasm (14) right forearm x4, left forearm x 3, posterior neck x 6, right dorsal hand x 1  Favor verrucae with [place] today   Once healed continue TMC 0.1 % ointment aa and cover with bandaid until clear.Avoid applying to face, groin, and axilla. Use as directed. Risk of skin atrophy with long-term use reviewed.   Topical steroids (such as triamcinolone, fluocinolone, fluocinonide, mometasone, clobetasol, halobetasol, betamethasone, hydrocortisone) can cause thinning and lightening of the skin if they are used for too long in the same area. Your physician has selected the right strength medicine for your problem and area affected on the body. Please use your medication only as directed by your physician to prevent side effects.   Prior to procedure, discussed risks of blister formation, small wound, skin dyspigmentation, or rare scar following cryotherapy.     Destruction of lesion - right forearm x4, left forearm x 3, posterior neck x 6, right dorsal hand x 1  Destruction method: cryotherapy   Informed consent: discussed and consent obtained   Lesion destroyed using liquid nitrogen: Yes   Cryotherapy cycles:  2 Outcome: patient tolerated procedure well with no complications   Post-procedure details: wound care instructions given    Return in about 30 days (around 03/21/2021), or lsc/ warts.  I, Ruthell Rummage, CMA, am acting as scribe for [employer],  MD.  Documentation: I have reviewed the above documentation for accuracy and completeness, and I agree with the above.  Forest Gleason, MD

## 2021-03-03 ENCOUNTER — Encounter: Payer: Self-pay | Admitting: Dermatology

## 2021-03-25 ENCOUNTER — Other Ambulatory Visit: Payer: Self-pay

## 2021-03-25 ENCOUNTER — Ambulatory Visit: Payer: Medicare Other | Admitting: Dermatology

## 2021-03-25 DIAGNOSIS — D369 Benign neoplasm, unspecified site: Secondary | ICD-10-CM

## 2021-03-25 DIAGNOSIS — D234 Other benign neoplasm of skin of scalp and neck: Secondary | ICD-10-CM

## 2021-03-25 DIAGNOSIS — D2361 Other benign neoplasm of skin of right upper limb, including shoulder: Secondary | ICD-10-CM | POA: Diagnosis not present

## 2021-03-25 DIAGNOSIS — L280 Lichen simplex chronicus: Secondary | ICD-10-CM | POA: Diagnosis not present

## 2021-03-25 NOTE — Patient Instructions (Addendum)
Cryotherapy Aftercare  . Wash gently with soap and water everyday.   Marland Kitchen Apply Vaseline and Band-Aid daily until healed.  Prior to procedure, discussed risks of blister formation, small wound, skin dyspigmentation, or rare scar following cryotherapy.    Continue triamciolone and cover with band aid when itching.   Topical steroids (such as triamcinolone, fluocinolone, fluocinonide, mometasone, clobetasol, halobetasol, betamethasone, hydrocortisone) can cause thinning and lightening of the skin if they are used for too long in the same area. Your physician has selected the right strength medicine for your problem and area affected on the body. Please use your medication only as directed by your physician to prevent side effects.

## 2021-03-25 NOTE — Progress Notes (Signed)
   Follow-Up Visit   Subjective  Andrew Vasquez. is a 75 y.o. male who presents for the following: Follow-up (Patient here today for 1 month LSC and wart follow up at bilateral arms and neck. Patient using mupirocin to open areas and was using TMC 0.1% to arms. He advises that areas have improved. ).  The following portions of the chart were reviewed this encounter and updated as appropriate:   Allergies  Meds  Problems  Med Hx  Surg Hx  Fam Hx      Review of Systems:  No other skin or systemic complaints except as noted in HPI or Assessment and Plan.  Objective  Well appearing patient in no apparent distress; mood and affect are within normal limits.  A focused examination was performed including arms, neck, hands. Relevant physical exam findings are noted in the Assessment and Plan.  Objective  Bilateral arms: Lichenified plaques bilateral arms   Objective  Neck x 7, right forearm x 1 (8): Multiple lichenified papules and plaques    Assessment & Plan  Lichen simplex chronicus Bilateral arms  Continue TMC 0.1% ointment and cover with band aid PRN itch. Avoid applying to face, groin, and axilla. Use as directed. Risk of skin atrophy with long-term use reviewed.   Topical steroids (such as triamcinolone, fluocinolone, fluocinonide, mometasone, clobetasol, halobetasol, betamethasone, hydrocortisone) can cause thinning and lightening of the skin if they are used for too long in the same area. Your physician has selected the right strength medicine for your problem and area affected on the body. Please use your medication only as directed by your physician to prevent side effects.    Other Related Medications triamcinolone ointment (KENALOG) 0.1 %  Benign neoplasm (8) Neck x 7, right forearm x 1  Favor verruca with LSC  Prior to procedure, discussed risks of blister formation, small wound, skin dyspigmentation, or rare scar following cryotherapy.        Destruction of lesion - Neck x 7, right forearm x 1  Destruction method: cryotherapy   Informed consent: discussed and consent obtained   Lesion destroyed using liquid nitrogen: Yes   Cryotherapy cycles:  2 Outcome: patient tolerated procedure well with no complications   Post-procedure details: wound care instructions given    Return in about 4 weeks (around 04/22/2021) for [facility] follow up.  Graciella Belton, RMA, am acting as scribe for Forest Gleason, MD .  Documentation: I have reviewed the above documentation for accuracy and completeness, and I agree with the above.  Forest Gleason, MD

## 2021-03-30 ENCOUNTER — Encounter: Payer: Self-pay | Admitting: Dermatology

## 2021-04-22 ENCOUNTER — Ambulatory Visit: Payer: Medicare Other | Admitting: Dermatology

## 2021-04-22 ENCOUNTER — Other Ambulatory Visit: Payer: Self-pay

## 2021-04-22 DIAGNOSIS — L280 Lichen simplex chronicus: Secondary | ICD-10-CM

## 2021-04-22 DIAGNOSIS — B079 Viral wart, unspecified: Secondary | ICD-10-CM

## 2021-04-22 DIAGNOSIS — D235 Other benign neoplasm of skin of trunk: Secondary | ICD-10-CM

## 2021-04-22 NOTE — Patient Instructions (Addendum)

## 2021-04-22 NOTE — Progress Notes (Signed)
   Follow-Up Visit   Subjective  Andrew Vasquez. is a 75 y.o. male who presents for the following: 1 month follow up (Patient here today for 1 month follow up on lichen simplex chronicus on bilateral arms. He currently uses tmc 0.1 ointment as needed for itch. He reports arms are doing much better. He also  is following up on some neoplasm he had frozen on neck and right arm. He reports some spots are doing much better. ).  The following portions of the chart were reviewed this encounter and updated as appropriate:  Allergies  Meds  Problems  Med Hx  Surg Hx  Fam Hx      Objective  Well appearing patient in no apparent distress; mood and affect are within normal limits.  A focused examination was performed including bilateral arms, neck, scalp, hands . Relevant physical exam findings are noted in the Assessment and Plan.  Objective  Right 3rd Finger x 1: Verrucous papules -- Discussed viral etiology and contagion.   Objective  left forearm x 2, right forearm x 3, neck / posterior scalp x 8 (13): Multiple lichenified papules and plaques   Objective  Right Forearm - Anterior: Lichenified plaques  Assessment & Plan  Verruca Right 3rd Finger x 1  Prior to procedure, discussed risks of blister formation, small wound, skin dyspigmentation, or rare scar following cryotherapy.    Destruction of lesion - Right 3rd Finger x 1  Destruction method: cryotherapy   Informed consent: discussed and consent obtained   Lesion destroyed using liquid nitrogen: Yes   Cryotherapy cycles:  2 Outcome: patient tolerated procedure well with no complications   Post-procedure details: wound care instructions given    Other benign neoplasm of skin of trunk (13) left forearm x 2, right forearm x 3, neck / posterior scalp x 8  Favor verruca with LSC  Prior to procedure, discussed risks of blister formation, small wound, skin dyspigmentation, or rare scar following cryotherapy.     Destruction of lesion - left forearm x 2, right forearm x 3, neck / posterior scalp x 8  Destruction method: cryotherapy   Informed consent: discussed and consent obtained   Lesion destroyed using liquid nitrogen: Yes   Cryotherapy cycles:  2 Outcome: patient tolerated procedure well with no complications   Post-procedure details: wound care instructions given    Lichen simplex chronicus Right Forearm - Anterior  Continue triamcinolone daily with bandage as needed to itchy areas. Avoid applying to face, groin, and axilla. Use as directed. Risk of skin atrophy with long-term use reviewed.   Topical steroids (such as triamcinolone, fluocinolone, fluocinonide, mometasone, clobetasol, halobetasol, betamethasone, hydrocortisone) can cause thinning and lightening of the skin if they are used for too long in the same area. Your physician has selected the right strength medicine for your problem and area affected on the body. Please use your medication only as directed by your physician to prevent side effects.    Other Related Medications triamcinolone ointment (KENALOG) 0.1 %  Return in about 2 months (around 06/22/2021) for lcs and wart follow up.  I, Ruthell Rummage, CMA, am acting as scribe for Forest Gleason, MD.  Documentation: I have reviewed the above documentation for accuracy and completeness, and I agree with the above.  Forest Gleason, MD

## 2021-04-27 ENCOUNTER — Encounter: Payer: Self-pay | Admitting: Dermatology

## 2021-07-02 ENCOUNTER — Other Ambulatory Visit: Payer: Self-pay

## 2021-07-02 ENCOUNTER — Ambulatory Visit: Payer: Medicare Other | Admitting: Dermatology

## 2021-07-02 DIAGNOSIS — L281 Prurigo nodularis: Secondary | ICD-10-CM

## 2021-07-02 NOTE — Patient Instructions (Signed)
Intralesional steroid injection side effects were reviewed including thinning of the skin and discoloration, such as redness, lightening or darkening.

## 2021-07-02 NOTE — Progress Notes (Signed)
   Follow-Up Visit   Subjective  Andrew Vasquez. is a 75 y.o. male who presents for the following: Follow-up (Patient here today for 2 month LSC and wart follow up. Patient is using [place] and covering areas at arms and covering with bandaids. Areas treated at last visit with LN2 not resolved per patient. There is one spot at right forearm that will not heal. ).  Patient feels that they have not really improved and are staying the same.   The following portions of the chart were reviewed this encounter and updated as appropriate:   Allergies  Meds  Problems  Med Hx  Surg Hx  Fam Hx      Review of Systems:  No other skin or systemic complaints except as noted in HPI or Assessment and Plan.  Objective  Well appearing patient in no apparent distress; mood and affect are within normal limits.  A focused examination was performed including arms, neck. Relevant physical exam findings are noted in the Assessment and Plan.  Right Forearm - Anterior Lichenified papules, nodules and plaques   Assessment & Plan  Prurigo nodularis Right Forearm - Anterior  Has failed topical steroids under occlusion and cryotherapy  Discussed risk of worsening if there is any wart virus at any of these sites (we did treat a wart at another area).   [place] [id number] Lot # [id number] Exp: Oct 2023  Intralesional steroid injection side effects were reviewed including thinning of the skin and discoloration, such as redness, lightening or darkening.  Discussed risk of worsening if any wart virus present where injections done.    Intralesional injection - Right Forearm - Anterior Location: posterior neck x 8, bilateral arms x 5, right 3rd finger base x 1  Informed Consent: Discussed risks (infection, pain, bleeding, bruising, thinning of the skin, loss of skin pigment, lack of resolution, and recurrence of lesion) and benefits of the procedure, as well as the alternatives. Informed consent was  obtained. Preparation: The area was prepared a standard fashion.  Procedure Details: An intralesional injection was performed with Kenalog 2.5 mg/cc. 3 cc in total were injected.  Total number of injections: >20  Plan: The patient was instructed on post-op care. Recommend OTC analgesia as needed for pain.   Return for 3-4 weeks .  Graciella Belton, RMA, am acting as scribe for Forest Gleason, MD .  Documentation: I have reviewed the above documentation for accuracy and completeness, and I agree with the above.  Forest Gleason, MD

## 2021-07-13 ENCOUNTER — Encounter: Payer: Self-pay | Admitting: Dermatology

## 2021-07-21 ENCOUNTER — Ambulatory Visit: Payer: Medicare Other | Admitting: Dermatology

## 2021-10-20 ENCOUNTER — Other Ambulatory Visit: Payer: Self-pay

## 2021-10-20 ENCOUNTER — Ambulatory Visit: Payer: Medicare Other | Admitting: Podiatry

## 2021-10-20 ENCOUNTER — Encounter: Payer: Self-pay | Admitting: Podiatry

## 2021-10-20 DIAGNOSIS — Z86010 Personal history of colonic polyps: Secondary | ICD-10-CM | POA: Insufficient documentation

## 2021-10-20 DIAGNOSIS — D696 Thrombocytopenia, unspecified: Secondary | ICD-10-CM | POA: Insufficient documentation

## 2021-10-20 DIAGNOSIS — K2100 Gastro-esophageal reflux disease with esophagitis, without bleeding: Secondary | ICD-10-CM | POA: Insufficient documentation

## 2021-10-20 DIAGNOSIS — M7751 Other enthesopathy of right foot: Secondary | ICD-10-CM | POA: Diagnosis not present

## 2021-10-20 NOTE — Progress Notes (Signed)
Subjective:  Patient ID: Andrew Vasquez., male    DOB: June 10, 1946,  MRN: [id number]  Chief Complaint  Patient presents with   Foot Pain    Right foot pain  PT stated he feels like a plantar wart is trying to come back on his foot he had one cut out years ago in that same spot     75 y.o. male presents with the above complaint.  Patient presents with right submetatarsal 2 pain.  Patient states it hurts to ambulate on.  He states hurts with every step.  Hurts with going barefooted floor.  He has not seen anyone else prior to seeing me.  He feels like there is a plantar wart that is coming back.  He does not have any skin lesions at this time.  He denies any other acute complaints he would like to discuss treatment options for this.  He wears boots to ambulate and working.   Review of Systems: Negative except as noted in the HPI. Denies N/V/F/Ch.  Past Medical History:  Diagnosis Date   Arthritis    Back pain    Depression    History of dysplastic nevus 12/18/2020   left upper back, mild   Hypertension    doen not tke meds for htn at this time   Kidney stone    Knee pain     Current Outpatient Medications:    terazosin (HYTRIN) 5 MG capsule, Take by mouth., Disp: , Rfl:    bisoprolol-hydrochlorothiazide (ZIAC) 5-6.25 MG tablet, Take 1 tablet by mouth daily., Disp: , Rfl:    mupirocin ointment (BACTROBAN) 2 %, Apply 1 application topically 2 (two) times daily. Until healed, Disp: 22 g, Rfl: 1   terazosin (HYTRIN) 5 MG capsule, Take 5 mg by mouth at bedtime., Disp: , Rfl:    triamcinolone ointment (KENALOG) 0.1 %, Apply to affected areas once daily and cover with bandaid. Avoid applying to face, groin, and axilla. Use as directed. Risk of skin atrophy with long-term use reviewed., Disp: 80 g, Rfl: 1  Social History   Tobacco Use  Smoking Status Former  Smokeless Tobacco Not on file    Allergies  Allergen Reactions   Sulfa Antibiotics Swelling    Other: confusion    Objective:  There were no vitals filed for this visit. There is no height or weight on file to calculate BMI. Constitutional Well developed. Well nourished.  Vascular Dorsalis pedis pulses palpable bilaterally. Posterior tibial pulses palpable bilaterally. Capillary refill normal to all digits.  No cyanosis or clubbing noted. Pedal hair growth normal.  Neurologic Normal speech. Oriented to person, place, and time. Epicritic sensation to light touch grossly present bilaterally.  Dermatologic Nails well groomed and normal in appearance. No open wounds. No skin lesions.  Orthopedic: Pain with range of motion of the second metatarsophalangeal joint pain with deep intra-articular pain.  Mild crepitus clinically appreciated.  No extensor or flexor tendinitis clinically appreciated.  No plantar plate tear noted.   Radiographs: None Assessment:   1. Capsulitis of metatarsophalangeal (MTP) joint of right foot    Plan:  Patient was evaluated and treated and all questions answered.  Right submetatarsal 2 capsulitis -I explained to the patient the etiology of capsulitis and various treatment options were extensively discussed.  Given the amount of pain that he is having I believe patient will benefit from a steroid injection to help decrease acute inflammatory component associate with pain.  Patient agrees with the plan like to proceed  with a steroid injection -I discussed shoe gear modification extensive detail as well as over-the-counter power steps orthotics.  He states he will obtain them right away. -I will see him back again in 4 weeks to reevaluate  No follow-ups on file.

## 2021-11-17 ENCOUNTER — Ambulatory Visit: Payer: Medicare Other | Admitting: Podiatry

## 2021-11-17 ENCOUNTER — Other Ambulatory Visit: Payer: Self-pay

## 2021-11-17 DIAGNOSIS — M7751 Other enthesopathy of right foot: Secondary | ICD-10-CM

## 2021-11-17 NOTE — Progress Notes (Signed)
Subjective:  Patient ID: Andrew Vasquez., male    DOB: 1946/01/24,  MRN: [id number]  Chief Complaint  Patient presents with   Foot Pain    Pt stated that he is doing better     75 y.o. male presents with the above complaint.  Patient presents with follow-up of capsulitis of right second metatarsophalangeal joint.  He states the injection helped considerably.  The shoe gear modification also help.  He has not obtained over-the-counter orthotics yet.  He would like to know if he can do another injection.  He denies any other acute complaints he had about 80% relief.   Review of Systems: Negative except as noted in the HPI. Denies N/V/F/Ch.  Past Medical History:  Diagnosis Date   Arthritis    Back pain    Depression    History of dysplastic nevus 12/18/2020   left upper back, mild   Hypertension    doen not tke meds for htn at this time   Kidney stone    Knee pain     Current Outpatient Medications:    bisoprolol-hydrochlorothiazide (ZIAC) 5-6.25 MG tablet, Take 1 tablet by mouth daily., Disp: , Rfl:    mupirocin ointment (BACTROBAN) 2 %, Apply 1 application topically 2 (two) times daily. Until healed, Disp: 22 g, Rfl: 1   terazosin (HYTRIN) 5 MG capsule, Take 5 mg by mouth at bedtime., Disp: , Rfl:    terazosin (HYTRIN) 5 MG capsule, Take by mouth., Disp: , Rfl:    triamcinolone ointment (KENALOG) 0.1 %, Apply to affected areas once daily and cover with bandaid. Avoid applying to face, groin, and axilla. Use as directed. Risk of skin atrophy with long-term use reviewed., Disp: 80 g, Rfl: 1  Social History   Tobacco Use  Smoking Status Former  Smokeless Tobacco Not on file    Allergies  Allergen Reactions   Sulfa Antibiotics Swelling    Other: confusion   Objective:  There were no vitals filed for this visit. There is no height or weight on file to calculate BMI. Constitutional Well developed. Well nourished.  Vascular Dorsalis pedis pulses palpable  bilaterally. Posterior tibial pulses palpable bilaterally. Capillary refill normal to all digits.  No cyanosis or clubbing noted. Pedal hair growth normal.  Neurologic Normal speech. Oriented to person, place, and time. Epicritic sensation to light touch grossly present bilaterally.  Dermatologic Nails well groomed and normal in appearance. No open wounds. No skin lesions.  Orthopedic: Pain with range of motion of the second metatarsophalangeal joint pain with deep intra-articular pain.  Mild crepitus clinically appreciated.  No extensor or flexor tendinitis clinically appreciated.  No plantar plate tear noted.   Radiographs: None Assessment:   1. Capsulitis of metatarsophalangeal (MTP) joint of right foot     Plan:  Patient was evaluated and treated and all questions answered.  Right submetatarsal 2 capsulitis -I explained to the patient the etiology of capsulitis and various treatment options were extensively discussed.  Given that he has some residual pain I believe would benefit from a second steroid injection.  Patient agrees with plan like to proceed with second steroid injection. -A steroid injection was performed at right submetatarsal 2 MTP joint using 1% plain Lidocaine and 10 mg of Kenalog. This was well tolerated.  -I re-discussed power steps and shoe gear modification and he states he will work on doing power steps.  He has already obtained [employer] which are functioning well.  No follow-ups on file.

## 2022-01-13 ENCOUNTER — Ambulatory Visit: Payer: Medicare Other | Admitting: Dermatology

## 2022-06-24 ENCOUNTER — Ambulatory Visit: Payer: Medicare Other | Admitting: Podiatry

## 2022-06-24 DIAGNOSIS — M7751 Other enthesopathy of right foot: Secondary | ICD-10-CM

## 2022-06-30 NOTE — Progress Notes (Signed)
  Subjective:  Patient ID: Andrew Vasquez., male    DOB: March 26, 1946,  MRN: [id number]  Chief Complaint  Patient presents with   Foot Pain    76 y.o. male presents with the above complaint.  Patient presents with follow-up of capsulitis of right second metatarsophalangeal joint.  He states he started coming back again to give him a good 7 to 8 months of relief.  He wants to know if he can do another injection.  The injections do help  Review of Systems: Negative except as noted in the HPI. Denies N/V/F/Ch.  Past Medical History:  Diagnosis Date   Arthritis    Back pain    Depression    History of dysplastic nevus 12/18/2020   left upper back, mild   Hypertension    doen not tke meds for htn at this time   Kidney stone    Knee pain     Current Outpatient Medications:    bisoprolol-hydrochlorothiazide (ZIAC) 5-6.25 MG tablet, Take 1 tablet by mouth daily., Disp: , Rfl:    mupirocin ointment (BACTROBAN) 2 %, Apply 1 application topically 2 (two) times daily. Until healed, Disp: 22 g, Rfl: 1   terazosin (HYTRIN) 5 MG capsule, Take 5 mg by mouth at bedtime., Disp: , Rfl:    terazosin (HYTRIN) 5 MG capsule, Take by mouth., Disp: , Rfl:    triamcinolone ointment (KENALOG) 0.1 %, Apply to affected areas once daily and cover with bandaid. Avoid applying to face, groin, and axilla. Use as directed. Risk of skin atrophy with long-term use reviewed., Disp: 80 g, Rfl: 1  Social History   Tobacco Use  Smoking Status Former  Smokeless Tobacco Not on file    Allergies  Allergen Reactions   Sulfa Antibiotics Swelling    Other: confusion   Objective:  There were no vitals filed for this visit. There is no height or weight on file to calculate BMI. Constitutional Well developed. Well nourished.  Vascular Dorsalis pedis pulses palpable bilaterally. Posterior tibial pulses palpable bilaterally. Capillary refill normal to all digits.  No cyanosis or clubbing noted. Pedal hair growth  normal.  Neurologic Normal speech. Oriented to person, place, and time. Epicritic sensation to light touch grossly present bilaterally.  Dermatologic Nails well groomed and normal in appearance. No open wounds. No skin lesions.  Orthopedic: Pain with range of motion of the second metatarsophalangeal joint pain with deep intra-articular pain.  Mild crepitus clinically appreciated.  No extensor or flexor tendinitis clinically appreciated.  No plantar plate tear noted.   Radiographs: None Assessment:   1. Capsulitis of metatarsophalangeal (MTP) joint of right foot      Plan:  Patient was evaluated and treated and all questions answered.  Right submetatarsal 2 capsulitis -I explained to the patient the etiology of capsulitis and various treatment options were extensively discussed.  Given that he has some residual pain I believe would benefit from a second steroid injection.  Patient agrees with plan like to proceed with second steroid injection. -Another steroid injection was performed at right submetatarsal 2 MTP joint using 1% plain Lidocaine and 10 mg of Kenalog. This was well tolerated. -I re-discussed power steps and shoe gear modification and he states he will work on doing power steps.  He has already obtained [employer] which are functioning well.  No follow-ups on file.

## 2022-09-02 ENCOUNTER — Encounter: Payer: Self-pay | Admitting: Podiatry

## 2022-09-02 ENCOUNTER — Ambulatory Visit: Payer: Medicare Other | Admitting: Podiatry

## 2022-09-02 DIAGNOSIS — M7751 Other enthesopathy of right foot: Secondary | ICD-10-CM

## 2022-09-02 NOTE — Progress Notes (Signed)
  Subjective:  Patient ID: Andrew Ralph., male    DOB: June 21, 1946,  MRN: [id number]  Chief Complaint  Patient presents with   Injections    76 y.o. male presents with the above complaint.  Patient presents with follow-up of capsulitis of right second metatarsophalangeal joint.  He states he started coming back again to give him a good 7 to 8 months of relief.  He wants to know if he can do another injection.  The injections do help  Review of Systems: Negative except as noted in the HPI. Denies N/V/F/Ch.  Past Medical History:  Diagnosis Date   Arthritis    Back pain    Depression    History of dysplastic nevus 12/18/2020   left upper back, mild   Hypertension    doen not tke meds for htn at this time   Kidney stone    Knee pain     Current Outpatient Medications:    bisoprolol-hydrochlorothiazide (ZIAC) 5-6.25 MG tablet, Take 1 tablet by mouth daily., Disp: , Rfl:    mupirocin ointment (BACTROBAN) 2 %, Apply 1 application topically 2 (two) times daily. Until healed, Disp: 22 g, Rfl: 1   terazosin (HYTRIN) 5 MG capsule, Take 5 mg by mouth at bedtime., Disp: , Rfl:    terazosin (HYTRIN) 5 MG capsule, Take by mouth., Disp: , Rfl:    triamcinolone ointment (KENALOG) 0.1 %, Apply to affected areas once daily and cover with bandaid. Avoid applying to face, groin, and axilla. Use as directed. Risk of skin atrophy with long-term use reviewed., Disp: 80 g, Rfl: 1  Social History   Tobacco Use  Smoking Status Former  Smokeless Tobacco Not on file    Allergies  Allergen Reactions   Sulfa Antibiotics Swelling    Other: confusion   Objective:  There were no vitals filed for this visit. There is no height or weight on file to calculate BMI. Constitutional Well developed. Well nourished.  Vascular Dorsalis pedis pulses palpable bilaterally. Posterior tibial pulses palpable bilaterally. Capillary refill normal to all digits.  No cyanosis or clubbing noted. Pedal hair growth  normal.  Neurologic Normal speech. Oriented to person, place, and time. Epicritic sensation to light touch grossly present bilaterally.  Dermatologic Nails well groomed and normal in appearance. No open wounds. No skin lesions.  Orthopedic: Pain with range of motion of the second metatarsophalangeal joint pain with deep intra-articular pain.  Mild crepitus clinically appreciated.  No extensor or flexor tendinitis clinically appreciated.  No plantar plate tear noted.   Radiographs: None Assessment:   1. Capsulitis of metatarsophalangeal (MTP) joint of right foot       Plan:  Patient was evaluated and treated and all questions answered.  Right submetatarsal 2 capsulitis -I explained to the patient the etiology of capsulitis and various treatment options were extensively discussed.  Given that he has some residual pain I believe would benefit from a second steroid injection.  Patient agrees with plan like to proceed with second steroid injection. -Another steroid injection was performed at right submetatarsal 2 MTP joint using 1% plain Lidocaine and 10 mg of Kenalog. This was well tolerated. -I re-discussed power steps and shoe gear modification and he states he will work on doing power steps.  He has already obtained [employer] which are functioning well.  No follow-ups on file.

## 2023-08-02 ENCOUNTER — Ambulatory Visit: Payer: Medicare Other | Admitting: Podiatry

## 2023-08-02 DIAGNOSIS — M62461 Contracture of muscle, right lower leg: Secondary | ICD-10-CM

## 2023-08-02 DIAGNOSIS — M722 Plantar fascial fibromatosis: Secondary | ICD-10-CM | POA: Diagnosis not present

## 2023-08-02 NOTE — Progress Notes (Signed)
Subjective:  Patient ID: Andrew Cure., male    DOB: [birth year],  MRN: [id number]  Chief Complaint  Patient presents with   Injections    Pt stated he would like to get another injection     77 y.o. male presents with the above complaint.  Patient presents with right heel pain that has been going for quite some time is progressive gotten worse worse with ambulation worse with pressure he would like to discuss treatment options for it he has not seen and was prior to seeing me for this pain scale 7 out of 10 dull aching nature.   Review of Systems: Negative except as noted in the HPI. Denies N/V/F/Ch.  Past Medical History:  Diagnosis Date   Arthritis    Back pain    Depression    History of dysplastic nevus 12/18/2020   left upper back, mild   Hypertension    doen not tke meds for htn at this time   Kidney stone    Knee pain     Current Outpatient Medications:    bisoprolol-hydrochlorothiazide (ZIAC) 5-6.25 MG tablet, Take 1 tablet by mouth daily., Disp: , Rfl:    mupirocin ointment (BACTROBAN) 2 %, Apply 1 application topically 2 (two) times daily. Until healed, Disp: 22 g, Rfl: 1   terazosin (HYTRIN) 5 MG capsule, Take 5 mg by mouth at bedtime., Disp: , Rfl:    terazosin (HYTRIN) 5 MG capsule, Take by mouth., Disp: , Rfl:    triamcinolone ointment (KENALOG) 0.1 %, Apply to affected areas once daily and cover with bandaid. Avoid applying to face, groin, and axilla. Use as directed. Risk of skin atrophy with long-term use reviewed., Disp: 80 g, Rfl: 1  Social History   Tobacco Use  Smoking Status Former  Smokeless Tobacco Not on file    Allergies  Allergen Reactions   Sulfa Antibiotics Swelling    Other: confusion   Objective:  There were no vitals filed for this visit. There is no height or weight on file to calculate BMI. Constitutional Well developed. Well nourished.  Vascular Dorsalis pedis pulses palpable bilaterally. Posterior tibial pulses palpable  bilaterally. Capillary refill normal to all digits.  No cyanosis or clubbing noted. Pedal hair growth normal.  Neurologic Normal speech. Oriented to person, place, and time. Epicritic sensation to light touch grossly present bilaterally.  Dermatologic Nails well groomed and normal in appearance. No open wounds. No skin lesions.  Orthopedic: Normal joint ROM without pain or crepitus bilaterally. No visible deformities. Tender to palpation at the calcaneal tuber right. No pain with calcaneal squeeze right. Ankle ROM diminished range of motion right. Silfverskiold Test: positive right.   Radiographs: None  Assessment:   1. Plantar fasciitis of right foot   2. Gastrocnemius equinus of right lower extremity    Plan:  Patient was evaluated and treated and all questions answered.  Plantar Fasciitis, right with underlying gastrocnemius equinus - XR reviewed as above.  - Educated on icing and stretching. Instructions given.  - Injection delivered to the plantar fascia as below. - DME: Plantar fascial brace dispensed to support the medial longitudinal arch of the foot and offload pressure from the heel and prevent arch collapse during weightbearing - Pharmacologic management: None  Procedure: Injection Tendon/Ligament Location: Right plantar fascia at the glabrous junction; medial approach. Skin Prep: alcohol Injectate: 0.5 cc 0.5% marcaine plain, 0.5 cc of 1% Lidocaine, 0.5 cc kenalog 10. Disposition: Patient tolerated procedure well. Injection site dressed with a  band-aid.  No follow-ups on file.

## 2023-08-30 ENCOUNTER — Ambulatory Visit: Payer: Medicare Other | Admitting: Podiatry

## 2023-08-30 DIAGNOSIS — M62461 Contracture of muscle, right lower leg: Secondary | ICD-10-CM

## 2023-08-30 DIAGNOSIS — M722 Plantar fascial fibromatosis: Secondary | ICD-10-CM | POA: Diagnosis not present

## 2023-08-30 NOTE — Progress Notes (Signed)
Subjective:  Patient ID: Andrew Cure., male    DOB: 1946/06/19,  MRN: [id number]  Chief Complaint  Patient presents with   Plantar Fasciitis    Right foot plantar fasciitis  Pt stated that he is still having some pain    77 y.o. male presents with the above complaint.  Patient presents with right heel pain that has been going for quite some time is progressive gotten worse worse with ambulation worse with pressure he would like to discuss treatment options for it he has not seen and was prior to seeing me for this pain scale 7 out of 10 dull aching nature.   Review of Systems: Negative except as noted in the HPI. Denies N/V/F/Ch.  Past Medical History:  Diagnosis Date   Arthritis    Back pain    Depression    History of dysplastic nevus 12/18/2020   left upper back, mild   Hypertension    doen not tke meds for htn at this time   Kidney stone    Knee pain     Current Outpatient Medications:    bisoprolol-hydrochlorothiazide (ZIAC) 5-6.25 MG tablet, Take 1 tablet by mouth daily., Disp: , Rfl:    mupirocin ointment (BACTROBAN) 2 %, Apply 1 application topically 2 (two) times daily. Until healed, Disp: 22 g, Rfl: 1   terazosin (HYTRIN) 5 MG capsule, Take 5 mg by mouth at bedtime., Disp: , Rfl:    terazosin (HYTRIN) 5 MG capsule, Take by mouth., Disp: , Rfl:    triamcinolone ointment (KENALOG) 0.1 %, Apply to affected areas once daily and cover with bandaid. Avoid applying to face, groin, and axilla. Use as directed. Risk of skin atrophy with long-term use reviewed., Disp: 80 g, Rfl: 1  Social History   Tobacco Use  Smoking Status Former  Smokeless Tobacco Not on file    Allergies  Allergen Reactions   Sulfa Antibiotics Swelling    Other: confusion   Objective:  There were no vitals filed for this visit. There is no height or weight on file to calculate BMI. Constitutional Well developed. Well nourished.  Vascular Dorsalis pedis pulses palpable  bilaterally. Posterior tibial pulses palpable bilaterally. Capillary refill normal to all digits.  No cyanosis or clubbing noted. Pedal hair growth normal.  Neurologic Normal speech. Oriented to person, place, and time. Epicritic sensation to light touch grossly present bilaterally.  Dermatologic Nails well groomed and normal in appearance. No open wounds. No skin lesions.  Orthopedic: Normal joint ROM without pain or crepitus bilaterally. No visible deformities. Tender to palpation at the calcaneal tuber right. No pain with calcaneal squeeze right. Ankle ROM diminished range of motion right. Silfverskiold Test: positive right.   Radiographs: None  Assessment:   1. Plantar fasciitis of right foot   2. Gastrocnemius equinus of right lower extremity     Plan:  Patient was evaluated and treated and all questions answered.  Plantar Fasciitis, right with underlying gastrocnemius equinus - XR reviewed as above.  - Educated on icing and stretching. Instructions given.  - Injection delivered to the plantar fascia as below. - DME: Plantar fascial brace dispensed to support the medial longitudinal arch of the foot and offload pressure from the heel and prevent arch collapse during weightbearing - Pharmacologic management: None  Procedure: Injection Tendon/Ligament Location: Right plantar fascia at the glabrous junction; medial approach. Skin Prep: alcohol Injectate: 0.5 cc 0.5% marcaine plain, 0.5 cc of 1% Lidocaine, 0.5 cc kenalog 10. Disposition: Patient tolerated procedure  well. Injection site dressed with a band-aid.  No follow-ups on file.

## 2024-03-28 ENCOUNTER — Other Ambulatory Visit: Payer: Self-pay | Admitting: Internal Medicine

## 2024-03-28 DIAGNOSIS — R270 Ataxia, unspecified: Secondary | ICD-10-CM

## 2024-03-28 DIAGNOSIS — R42 Dizziness and giddiness: Secondary | ICD-10-CM

## 2024-04-13 ENCOUNTER — Other Ambulatory Visit: Payer: Self-pay | Admitting: Orthopedic Surgery

## 2024-04-16 ENCOUNTER — Other Ambulatory Visit: Payer: Self-pay | Admitting: Internal Medicine

## 2024-04-16 DIAGNOSIS — R42 Dizziness and giddiness: Secondary | ICD-10-CM

## 2024-04-16 DIAGNOSIS — R270 Ataxia, unspecified: Secondary | ICD-10-CM

## 2024-04-24 ENCOUNTER — Encounter: Payer: Self-pay | Admitting: Urgent Care

## 2024-04-24 ENCOUNTER — Encounter
Admission: RE | Admit: 2024-04-24 | Discharge: 2024-04-24 | Disposition: A | Discharge status: Home / Self Care | Source: Ambulatory Visit | Attending: Orthopedic Surgery | Admitting: Orthopedic Surgery

## 2024-04-24 ENCOUNTER — Telehealth: Payer: Self-pay | Admitting: Urgent Care

## 2024-04-24 ENCOUNTER — Other Ambulatory Visit: Payer: Self-pay

## 2024-04-24 VITALS — BP 142/80 | HR 51 | Resp 18 | Ht 73.0 in | Wt 232.8 lb

## 2024-04-24 DIAGNOSIS — I10 Essential (primary) hypertension: Secondary | ICD-10-CM | POA: Diagnosis not present

## 2024-04-24 DIAGNOSIS — Z01812 Encounter for preprocedural laboratory examination: Secondary | ICD-10-CM

## 2024-04-24 DIAGNOSIS — Z22322 Carrier or suspected carrier of Methicillin resistant Staphylococcus aureus: Secondary | ICD-10-CM

## 2024-04-24 DIAGNOSIS — Z01810 Encounter for preprocedural cardiovascular examination: Secondary | ICD-10-CM | POA: Diagnosis not present

## 2024-04-24 DIAGNOSIS — R001 Bradycardia, unspecified: Secondary | ICD-10-CM | POA: Insufficient documentation

## 2024-04-24 DIAGNOSIS — Z01818 Encounter for other preprocedural examination: Secondary | ICD-10-CM | POA: Diagnosis present

## 2024-04-24 DIAGNOSIS — I491 Atrial premature depolarization: Secondary | ICD-10-CM | POA: Diagnosis not present

## 2024-04-24 DIAGNOSIS — M170 Bilateral primary osteoarthritis of knee: Secondary | ICD-10-CM | POA: Diagnosis not present

## 2024-04-24 HISTORY — DX: Benign neoplasm, unspecified site: D36.9

## 2024-04-24 HISTORY — DX: Anxiety disorder, unspecified: F41.9

## 2024-04-24 HISTORY — DX: Thrombocytopenia, unspecified: D69.6

## 2024-04-24 HISTORY — DX: Personal history of urinary calculi: Z87.442

## 2024-04-24 HISTORY — DX: Other hemorrhoids: K64.8

## 2024-04-24 HISTORY — DX: Gastro-esophageal reflux disease with esophagitis, without bleeding: K21.00

## 2024-04-24 HISTORY — DX: Carrier or suspected carrier of methicillin resistant Staphylococcus aureus: Z22.322

## 2024-04-24 HISTORY — DX: Pneumonia, unspecified organism: J18.9

## 2024-04-24 HISTORY — DX: Bilateral primary osteoarthritis of knee: M17.0

## 2024-04-24 LAB — URINALYSIS, ROUTINE W REFLEX MICROSCOPIC
Bilirubin Urine: NEGATIVE
Glucose, UA: NEGATIVE mg/dL
Hgb urine dipstick: NEGATIVE
Ketones, ur: NEGATIVE mg/dL
Leukocytes,Ua: NEGATIVE
Nitrite: NEGATIVE
Protein, ur: NEGATIVE mg/dL
Specific Gravity, Urine: 1.014 (ref 1.005–1.030)
pH: 5 (ref 5.0–8.0)

## 2024-04-24 LAB — COMPREHENSIVE METABOLIC PANEL WITH GFR
ALT: 21 U/L (ref 0–44)
AST: 20 U/L (ref 15–41)
Albumin: 4.3 g/dL (ref 3.5–5.0)
Alkaline Phosphatase: 51 U/L (ref 38–126)
Anion gap: 9 (ref 5–15)
BUN: 19 mg/dL (ref 8–23)
CO2: 30 mmol/L (ref 22–32)
Calcium: 8.8 mg/dL — ABNORMAL LOW (ref 8.9–10.3)
Chloride: 99 mmol/L (ref 98–111)
Creatinine, Ser: 0.95 mg/dL (ref 0.61–1.24)
GFR, Estimated: >60 mL/min (ref >60)
Glucose, Bld: 77 mg/dL (ref 70–99)
Potassium: 3.8 mmol/L (ref 3.5–5.1)
Sodium: 138 mmol/L (ref 135–145)
Total Bilirubin: 1 mg/dL (ref 0.0–1.2)
Total Protein: 6.9 g/dL (ref 6.5–8.1)

## 2024-04-24 LAB — CBC WITH DIFFERENTIAL/PLATELET
Abs Immature Granulocytes: 0.09 10*3/uL — ABNORMAL HIGH (ref 0.00–0.07)
Basophils Absolute: 0.1 10*3/uL (ref 0.0–0.1)
Basophils Relative: 1 %
Eosinophils Absolute: 0.1 10*3/uL (ref 0.0–0.5)
Eosinophils Relative: 1 %
HCT: 46.7 % (ref 39.0–52.0)
Hemoglobin: 14.9 g/dL (ref 13.0–17.0)
Immature Granulocytes: 1 %
Lymphocytes Relative: 24 %
Lymphs Abs: 1.8 10*3/uL (ref 0.7–4.0)
MCH: 28.9 pg (ref 26.0–34.0)
MCHC: 31.9 g/dL (ref 30.0–36.0)
MCV: 90.7 fL (ref 80.0–100.0)
Monocytes Absolute: 0.6 10*3/uL (ref 0.1–1.0)
Monocytes Relative: 9 %
Neutro Abs: 4.8 10*3/uL (ref 1.7–7.7)
Neutrophils Relative %: 64 %
Platelets: 108 10*3/uL — ABNORMAL LOW (ref 150–400)
RBC: 5.15 MIL/uL (ref 4.22–5.81)
RDW: 13.5 % (ref 11.5–15.5)
WBC: 7.4 10*3/uL (ref 4.0–10.5)
nRBC: 0 % (ref 0.0–0.2)

## 2024-04-24 LAB — SURGICAL PCR SCREEN
MRSA, PCR: POSITIVE — AB
Staphylococcus aureus: POSITIVE — AB

## 2024-04-24 MED ORDER — MUPIROCIN 2 % EX OINT: TOPICAL_OINTMENT | CUTANEOUS | 0 refills | Status: DC

## 2024-04-24 NOTE — Patient Instructions (Addendum)
 Your procedure is scheduled on: 05/07/24 - Monday Report to the Registration Desk on the 1st floor of the [facility]. To find out your arrival time, please call [phone] between 1PM - 3PM on: 05/04/24 - Friday If your arrival time is 6:00 am, do not arrive before that time as the [facility] entrance doors do not open until 6:00 am.  REMEMBER: Instructions that are not followed completely may result in serious medical risk, up to and including death; or upon the discretion of your surgeon and anesthesiologist your surgery may need to be rescheduled.  Do not eat food after midnight the night before surgery.  No gum chewing or hard candies.  You may however, drink CLEAR liquids up to 2 hours before you are scheduled to arrive for your surgery. Do not drink anything within 2 hours of your scheduled arrival time.  Clear liquids include: - water   - apple juice without pulp - gatorade (not RED colors) - black coffee or tea (Do NOT add milk or creamers to the coffee or tea) Do NOT drink anything that is not on this list.  In addition, your doctor has ordered for you to drink the provided:  Ensure Pre-Surgery Clear Carbohydrate Drink Drinking this carbohydrate drink up to two hours before surgery helps to reduce insulin resistance and improve patient outcomes. Please complete drinking 2 hours before scheduled arrival time.  One week prior to surgery: Stop beginning 05/27, Anti-inflammatories (NSAIDS) such as Advil, Aleve, Ibuprofen, Motrin, Naproxen, Naprosyn and Aspirin based products such as Excedrin, Goody's Powder, BC Powder. You may take Tylenol  if needed for pain up until the day of surgery.  Stop ANY OVER THE COUNTER supplements until after surgery.  You may take Tylenol  if needed for pain up until the day of surgery.   ON THE DAY OF SURGERY ONLY TAKE THESE MEDICATIONS WITH SIPS OF WATER :  none   No Alcohol for 24 hours before or after surgery.  No Smoking including  e-cigarettes for 24 hours before surgery.  No chewable tobacco products for at least 6 hours before surgery.  No nicotine patches on the day of surgery.  Do not use any "recreational" drugs for at least a week (preferably 2 weeks) before your surgery.  Please be advised that the combination of cocaine and anesthesia may have negative outcomes, up to and including death. If you test positive for cocaine, your surgery will be cancelled.  On the morning of surgery brush your teeth with toothpaste and water , you may rinse your mouth with mouthwash if you wish. Do not swallow any toothpaste or mouthwash.  Use CHG Soap or wipes as directed on instruction sheet.  Do not wear jewelry, make-up, hairpins, clips or nail polish.  For welded (permanent) jewelry: bracelets, anklets, waist bands, etc.  Please have this removed prior to surgery.  If it is not removed, there is a chance that hospital personnel will need to cut it off on the day of surgery.  Do not wear lotions, powders, or perfumes.   Do not shave body hair from the neck down 48 hours before surgery.  Contact lenses, hearing aids and dentures may not be worn into surgery.  Do not bring valuables to the hospital. [facility] is not responsible for any missing/lost belongings or valuables.   Notify your doctor if there is any change in your medical condition (cold, fever, infection).  Wear comfortable clothing (specific to your surgery type) to the hospital.  After surgery, you can  help prevent lung complications by doing breathing exercises.  Take deep breaths and cough every 1-2 hours. Your doctor may order a device called an Incentive Spirometer to help you take deep breaths. When coughing or sneezing, hold a pillow firmly against your incision with both hands. This is called "splinting." Doing this helps protect your incision. It also decreases belly discomfort.  If you are being admitted to the hospital overnight, leave your  suitcase in the car. After surgery it may be brought to your room.  In case of increased patient census, it may be necessary for you, the patient, to continue your postoperative care in the Same Day Surgery department.  If you are being discharged the day of surgery, you will not be allowed to drive home. You will need a responsible individual to drive you home and stay with you for 24 hours after surgery.   If you are taking public transportation, you will need to have a responsible individual with you.  Please call the Pre-admissions Testing Dept. at [phone] if you have any questions about these instructions.  Surgery Visitation Policy:  Patients having surgery or a procedure may have two visitors.  Children under the age of 97 must have an adult with them who is not the patient.  Inpatient Visitation:    Visiting hours are 7 a.m. to 8 p.m. Up to four visitors are allowed at one time in a patient room. The visitors may rotate out with other people during the day.  One visitor age 65 or older may stay with the patient overnight and must be in the room by 8 p.m.    Pre-operative 5 CHG Bath Instructions   You can play a key role in reducing the risk of infection after surgery. Your skin needs to be as free of germs as possible. You can reduce the number of germs on your skin by washing with CHG (chlorhexidine gluconate) soap before surgery. CHG is an antiseptic soap that kills germs and continues to kill germs even after washing.   DO NOT use if you have an allergy to chlorhexidine/CHG or antibacterial soaps. If your skin becomes reddened or irritated, stop using the CHG and notify one of our RNs at [phone].   Please shower with the CHG soap starting 4 days before surgery using the following schedule:  05/29 - 06/02.                Please keep in mind the following:  DO NOT shave, including legs and underarms, starting the day of your first shower.   You may shave your  face at any point before/day of surgery.  Place clean sheets on your bed the day you start using CHG soap. Use a clean washcloth (not used since being washed) for each shower. DO NOT sleep with pets once you start using the CHG.   CHG Shower Instructions:  If you choose to wash your hair and private area, wash first with your normal shampoo/soap.  After you use shampoo/soap, rinse your hair and body thoroughly to remove shampoo/soap residue.  Turn the water  OFF and apply about 3 tablespoons (45 ml) of CHG soap to a CLEAN washcloth.  Apply CHG soap ONLY FROM YOUR NECK DOWN TO YOUR TOES (washing for 3-5 minutes)  DO NOT use CHG soap on face, private areas, open wounds, or sores.  Pay special attention to the area where your surgery is being performed.  If you are having back surgery, having someone  wash your back for you may be helpful. Wait 2 minutes after CHG soap is applied, then you may rinse off the CHG soap.  Pat dry with a clean towel  Put on clean clothes/pajamas   If you choose to wear lotion, please use ONLY the CHG-compatible lotions on the back of this paper.     Additional instructions for the day of surgery: DO NOT APPLY any lotions, deodorants, cologne, or perfumes.   Put on clean/comfortable clothes.  Brush your teeth.  Ask your nurse before applying any prescription medications to the skin.      CHG Compatible Lotions   Aveeno Moisturizing lotion  Cetaphil Moisturizing Cream  Cetaphil Moisturizing Lotion  Clairol Herbal Essence Moisturizing Lotion, Dry Skin  Clairol Herbal Essence Moisturizing Lotion, Extra Dry Skin  Clairol Herbal Essence Moisturizing Lotion, Normal Skin  Curel Age Defying Therapeutic Moisturizing Lotion with Alpha Hydroxy  Curel Extreme Care Body Lotion  Curel Soothing Hands Moisturizing Hand Lotion  Curel Therapeutic Moisturizing Cream, Fragrance-Free  Curel Therapeutic Moisturizing Lotion, Fragrance-Free  Curel Therapeutic Moisturizing  Lotion, Original Formula  Eucerin Daily Replenishing Lotion  Eucerin Dry Skin Therapy Plus Alpha Hydroxy Crme  Eucerin Dry Skin Therapy Plus Alpha Hydroxy Lotion  Eucerin Original Crme  Eucerin Original Lotion  Eucerin Plus Crme Eucerin Plus Lotion  Eucerin TriLipid Replenishing Lotion  Keri Anti-Bacterial Hand Lotion  Keri Deep Conditioning Original Lotion Dry Skin Formula Softly Scented  Keri Deep Conditioning Original Lotion, Fragrance Free Sensitive Skin Formula  Keri Lotion Fast Absorbing Fragrance Free Sensitive Skin Formula  Keri Lotion Fast Absorbing Softly Scented Dry Skin Formula  Keri Original Lotion  Keri Skin Renewal Lotion Keri Silky Smooth Lotion  Keri Silky Smooth Sensitive Skin Lotion  Nivea Body Creamy Conditioning Oil  Nivea Body Extra Enriched Lotion  Nivea Body Original Lotion  Nivea Body Sheer Moisturizing Lotion Nivea Crme  Nivea Skin Firming Lotion  NutraDerm 30 Skin Lotion  NutraDerm Skin Lotion  NutraDerm Therapeutic Skin Cream  NutraDerm Therapeutic Skin Lotion  ProShield Protective Hand Cream  Provon moisturizing lotion  How to Use an Incentive Spirometer  An incentive spirometer is a tool that measures how well you are filling your lungs with each breath. Learning to take long, deep breaths using this tool can help you keep your lungs clear and active. This may help to reverse or lessen your chance of developing breathing (pulmonary) problems, especially infection. You may be asked to use a spirometer: After a surgery. If you have a lung problem or a history of smoking. After a long period of time when you have been unable to move or be active. If the spirometer includes an indicator to show the highest number that you have reached, your health care provider or respiratory therapist will help you set a goal. Keep a log of your progress as told by your health care provider. What are the risks? Breathing too quickly may cause dizziness or cause you  to pass out. Take your time so you do not get dizzy or light-headed. If you are in pain, you may need to take pain medicine before doing incentive spirometry. It is harder to take a deep breath if you are having pain. How to use your incentive spirometer  Sit up on the edge of your bed or on a chair. Hold the incentive spirometer so that it is in an upright position. Before you use the spirometer, breathe out normally. Place the mouthpiece in your mouth. Make sure your lips  are closed tightly around it. Breathe in slowly and as deeply as you can through your mouth, causing the piston or the ball to rise toward the top of the chamber. Hold your breath for 3-5 seconds, or for as long as possible. If the spirometer includes a coach indicator, use this to guide you in breathing. Slow down your breathing if the indicator goes above the marked areas. Remove the mouthpiece from your mouth and breathe out normally. The piston or ball will return to the bottom of the chamber. Rest for a few seconds, then repeat the steps 10 or more times. Take your time and take a few normal breaths between deep breaths so that you do not get dizzy or light-headed. Do this every 1-2 hours when you are awake. If the spirometer includes a goal marker to show the highest number you have reached (best effort), use this as a goal to work toward during each repetition. After each set of 10 deep breaths, cough a few times. This will help to make sure that your lungs are clear. If you have an incision on your chest or abdomen from surgery, place a pillow or a rolled-up towel firmly against the incision when you cough. This can help to reduce pain while taking deep breaths and coughing. General tips When you are able to get out of bed: Walk around often. Continue to take deep breaths and cough in order to clear your lungs. Keep using the incentive spirometer until your health care provider says it is okay to stop using it. If  you have been in the hospital, you may be told to keep using the spirometer at home. Contact a health care provider if: You are having difficulty using the spirometer. You have trouble using the spirometer as often as instructed. Your pain medicine is not giving enough relief for you to use the spirometer as told. You have a fever. Get help right away if: You develop shortness of breath. You develop a cough with bloody mucus from the lungs. You have fluid or blood coming from an incision site after you cough. Summary An incentive spirometer is a tool that can help you learn to take long, deep breaths to keep your lungs clear and active. You may be asked to use a spirometer after a surgery, if you have a lung problem or a history of smoking, or if you have been inactive for a long period of time. Use your incentive spirometer as instructed every 1-2 hours while you are awake. If you have an incision on your chest or abdomen, place a pillow or a rolled-up towel firmly against your incision when you cough. This will help to reduce pain. Get help right away if you have shortness of breath, you cough up bloody mucus, or blood comes from your incision when you cough. This information is not intended to replace advice given to you by your health care provider. Make sure you discuss any questions you have with your health care provider. Document Revised: 02/11/2020 Document Reviewed: 02/11/2020 Elsevier Patient Education  2023 Elsevier Inc.    POLAR CARE INFORMATION  MassAdvertisement.it  How to use [facility] Therapy System?  YouTube   ShippingScam.co.uk  OPERATING INSTRUCTIONS  Start the product With dry hands, connect the transformer to the electrical connection located on the top of the cooler. Next, plug the transformer into an appropriate electrical outlet. The unit will automatically start running at this point.  To stop the pump,  disconnect  electrical power.  Unplug to stop the product when not in use. Unplugging the Polar Care unit turns it off. Always unplug immediately after use. Never leave it plugged in while unattended. Remove pad.    FIRST ADD WATER  TO FILL LINE, THEN ICE---Replace ice when existing ice is almost melted  1 Discuss Treatment with your Licensed Health Care Practitioner and Use Only as Prescribed 2 Apply Insulation Barrier & Cold Therapy Pad 3 Check for Moisture 4 Inspect Skin Regularly  Tips and Trouble Shooting Usage Tips 1. Use cubed or chunked ice for optimal performance. 2. It is recommended to drain the Pad between uses. To drain the pad, hold the Pad upright with the hose pointed toward the ground. Depress the black plunger and allow water  to drain out. 3. You may disconnect the Pad from the unit without removing the pad from the affected area by depressing the silver tabs on the hose coupling and gently pulling the hoses apart. The Pad and unit will seal itself and will not leak. Note: Some dripping during release is normal. 4. DO NOT RUN PUMP WITHOUT WATER ! The pump in this unit is designed to run with water . Running the unit without water  will cause permanent damage to the pump. 5. Unplug unit before removing lid.  TROUBLESHOOTING GUIDE Pump not running, Water  not flowing to the pad, Pad is not getting cold 1. Make sure the transformer is plugged into the wall outlet. 2. Confirm that the ice and water  are filled to the indicated levels. 3. Make sure there are no kinks in the pad. 4. Gently pull on the blue tube to make sure the tube/pad junction is straight. 5. Remove the pad from the treatment site and ll it while the pad is lying at; then reapply. 6. Confirm that the pad couplings are securely attached to the unit. Listen for the double clicks (Figure 1) to confirm the pad couplings are securely attached.  Leaks    Note: Some condensation on the lines, controller, and pads is unavoidable,  especially in warmer climates. 1. If using a Breg Polar Care Cold Therapy unit with a detachable Cold Therapy Pad, and a leak exists (other than condensation on the lines) disconnect the pad couplings. Make sure the silver tabs on the couplings are depressed before reconnecting the pad to the pump hose; then confirm both sides of the coupling are properly clicked in. 2. If the coupling continues to leak or a leak is detected in the pad itself, stop using it and call [employer] Customer Care at [phone].  Cleaning After use, empty and dry the unit with a soft cloth. Warm water  and mild detergent may be used occasionally to clean the pump and tubes.  WARNING: The Polar Care Cube can be cold enough to cause serious injury, including full skin necrosis. Follow these Operating Instructions, and carefully read the Product Insert (see pouch on side of unit) and the Cold Therapy Pad Fitting Instructions (provided with each Cold Therapy Pad) prior to use.

## 2024-04-24 NOTE — Progress Notes (Signed)
  Perioperative Services  Abnormal Lab Notification and Treatment Plan of Care  Date: 04/24/24  Name: Andrew AGUAYO Sr. MRN:   [id number]  Re: Abnormal labs noted during PAT appointment  Provider Notified:  Venus Ginsberg, MD Notification mode: Routed and/or faxed via [facility]  Labs of concern: Lab Results  Component Value Date   STAPHAUREUS POSITIVE (A) 04/24/2024   MRSAPCR POSITIVE (A) 04/24/2024    Notes: Patient is scheduled for an elective RIGHT TOTAL KNEE ARTHROPLASTY on 06/022025. He is scheduled to receive CEFAZOLIN  pre-operatively. Pre-surgical PCR (+) for MRSA; see above.  PLANS:  Review renal function. Estimated Creatinine Clearance: 85.7 mL/min (by C-G formula based on SCr of 0.95 mg/dL).  Review allergies. No documented allergy to vancomycin.  Order added for VANCOMYCIN 1 GRAM IV to current preoperative prophylactic regimen.   Patient with orders for both CEFAZOLIN  + VANCOMYCIN to be given in the setting of documented MRSA (+) surgical PCR.   Guidelines suggest that a beta-lactam antibiotic (first or second generation cephalosporin) should be added for activity against gram-negative organisms.  Vancomycin appears to be less effective than cefazolin  for preventing SSIs caused by MSSA. For this reason, the use of vancomycin in combination with cefazolin  is favored for prevention of SSI due to MRSA and coagulase-negative staphylococci.  Medical history in [facility] updated to reflect (+) PCR result indicating nasal MRSA colonization   Rx for additional preoperative nasal decolonization sent to patient's pharmacy of record.   Meds ordered this encounter  Medications   mupirocin  ointment (BACTROBAN ) 2 %    Sig: Apply small amount to the inside of both nostrils TWICE a day for the next 5 days.    Dispense:  15 g    Refill:  0    Please contact the patient as soon as it is available for pickup. Rx is for preoperative nasal decolonization following (+) MRSA result on PCR  testing. Needs to be started ASAP.   Andrew Caroline, MSN, APRN, FNP-C, CEN [facility]  Perioperative Services Nurse Practitioner Phone: [phone] 04/24/24 2:50 PM

## 2024-04-26 NOTE — Progress Notes (Signed)
 Perioperative Services Pre-Admission/Anesthesia Testing    Date: 04/26/24  Name: Andrew MANS Sr. 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 Venus Ginsberg, MD.  In preparation for his procedure, patient presented to the PAT clinic on the morning of 04/24/2024 for preoperative interview and testing.  In review of his risk preoperative ECG, patient with noted changes.  Tracing shows sinus bradycardia with PACs at a rate of 54 bpm.  There are marked T wave inversions noted in the anterolateral leads, which have progressed when compared to previous tracing on 01/09/2015.  Findings can be seen in multiple circumstances including LVH with strain, ischemia, and apical hypertrophic cardiomyopathy.  Patient does not see cardiology.  In review of his EMR, patient has not undergone any previous cardiovascular testing.  Past medical history significant for hypertension, thrombocytopenia, reflux esophagitis OA, anxiety, depression.  ECG:    Impression and Plan:  Andrew GUZZETTA Sr. is scheduled for elective orthopedic surgery in the near future.  Patient's reoperative ECG abnormal.  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  While there were changes present on ECG back in 2016, noted findings seem  to have progressed and are more pronounced on preoperative tracing.    In light of the fact that patient has never had a cardiovascular workup from what I can tell, I feel that patient will need to see cardiology for preoperative evaluation and clearance prior to elective orthopedic surgery.  Preoperative ECG reviewed with medicine Claudius Cumins, MD) who agrees with my assessment.  Dr. Claudius Cumins advised that he would take care of referred patient to [facility] cardiology for the aforementioned preoperative consult and clearance.  No changes are being made to the OR schedule at this time.  I have communicated the results of the ECG to patient's primary attending surgeon.  I have also made him aware of recommendations from both myself and medicine regarding need for preoperative cardiovascular evaluation and clearance.  Surgeon to contact cardiology office to determine whether or not this can be expedited to avoid having to postpone patient's currently scheduled surgery on 05/07/2024.  Surgeon is aware that patient will need diagnostic testing, which at minimum will include an echocardiogram.  Patient surgery date is contingent upon cardiology services availability to get patient seen and through any diagnostic testing being necessary.   No other needs from the PAT department at this time.  Will follow-up on disposition and clearance from cardiology.  Will forward copy of today's notes to cardiology service in efforts to supplement referral from Dr. Claudius Cumins.  Renate Caroline, MSN, APRN, FNP-C, CEN [facility]  Perioperative Services Nurse Practitioner Phone: [phone] Fax: [phone] 04/26/24 8:39 AM  NOTE: This note has been prepared using Dragon dictation software. Despite my best ability to proofread, there is always the potential that  unintentional transcriptional errors may still occur from this process.

## 2024-05-06 NOTE — Progress Notes (Addendum)
 Perioperative Services Pre-Admission/Anesthesia Testing    Date: 05/06/24  Name: Andrew PAINO Sr. MRN:   [id number]  Re: Abnormal ECG; plans for surgery  Planned Surgical Procedure(s):   Case: [id number] Date/Time: 05/07/24 1007  Procedure: ARTHROPLASTY, KNEE, TOTAL (Right: Knee)  Anesthesia type: Choice  Diagnosis:      Primary osteoarthritis of right knee [M17.11]     Left knee pain, unspecified chronicity [M25.562]  Pre-op diagnosis:      Primary osteoarthritis of both knees M17.0      Pain in both knees, unspecified chronicity M25.561, M25.562  Location: [facility] OR ROOM 02 / [facility]  Surgeons: Venus Ginsberg, MD   Clinical Notes:  Patient is scheduled for the above procedure on 05/07/2024 with Dr. Venus Ginsberg, MD.  In preparation for his procedure, patient presented to the PAT clinic on 04/24/2024 for preoperative interview and testing.  In review of his preoperative ECG, patient with significant diffuse T wave changes in the anterolateral leads.  When comparing ECG to previous obtained and 2016, patient with noted changes and the precordial leads.  Without status, preoperative ECG revealed changes that were much more pronounced.  DDx at the time was LVH with strain versus ischemia versus Yamaguchi syndrome (apical HCM).  Patient has not been seen previously by cardiology.  No previous cardiovascular testing noted.  Patient was referred to cardiology for further evaluation and preoperative clearance.  Patient was seen and consult by cardiology on 04/27/2024 by Dr. Isabell Manzanilla, DO; notes reviewed.  At the time of the consult, patient was reporting elevated blood pressures, however endorsed intermittent compliance with prescribed medical therapy.  Blood pressure in the office was documented at 142/77 mmHg on beta-blocker/diuretic (bisoprolol/HCTZ) and alpha-blocker (terazosin) therapies.  Patient was not on any type of lipid-lowering medications.  No  diabetes or OSAH diagnoses.  Functional capacity overall good; per the [facility], patient able to achieve >4 METS physical activity without significant cardiovascular symptoms.  In efforts to further evaluate patient's ECG changes and provide risk stratification prior to upcoming surgery, patient was sent for TTE.  Patient to follow-up with outpatient cardiology following testing for ongoing care.  TTE performed on 05/02/2024 revealed a normal left ventricular systolic function with an EF of >55%. There was mild apical LVH.  There were no regional wall motion abnormalities.  Left ventricular diastolic Doppler parameters were normal.  GLS -12.9%. Left atrium was mildly enlarged.  Right ventricular size and function normal with a TAPSE measuring 2.4 cm  (normal range >/= 1.6 cm).  RVSP = 33 mmHg.  There was trivial mitral and tricuspid valve regurgitation. All transvalvular gradients were noted to be normal providing no evidence suggestive of valvular stenosis. Aorta normal in size with no evidence of ectasia or aneurysmal dilatation.  Following cardiovascular consult and TTE, I followed up with cardiology regarding findings; spoke with Dr. Braxton Calico, DO.  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  Patient did not have any obstructive gradients, therefore cardiology feels that the patient is acceptable to proceed with planned elective surgical intervention without need for further evaluation prior to surgery cardiology did note that further review of the TTE measurements would be needed to determine if patient criteria for apical HCM. In order to diagnose apical HCM, TTE will need to demonstrate apical hypertrophy, apical wall thickness at least 15 mm, and anterior/posterior wall ratio of >/= 1.5. If he is found to meet criteria, further evaluation will be  needed at a later date via cardiac MRI.  Recommending genetic testing for family due to variants of apical HCM to be  passed down and seen in other forms of cardiomyopathy (i.e. non-compaction cardiomyopathy). MD to review findings and suggestions with patient during next clinic visit.   Impression and Plan:  Andrew VANTOL Sr. is scheduled for elective orthopedic surgery on 05/07/2024.  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  Patient referred to cardiology and underwent noninvasive cardiovascular testing.  TTE with normal left ventricular systolic function and mild apical LVH.  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  Cardiology to further evaluate TTE measurements to determine need for further cardiovascular testing via cardiac MRI.  With that being said, patient has appropriately been evaluated by cardiology.  Following TTE, patient with no obstructive gradients, therefore cardiology has deemed patient appropriate to proceed with planned surgical intervention with no further testing at this time. Based on clinical review, barring any significant acute changes in the patient's overall condition, it is anticipated that he will be able to proceed with the planned surgical intervention. Any acute changes in clinical condition may necessitate his procedure being postponed and/or cancelled. Patient will meet with anesthesia team (MD and/or CRNA) on this day of his procedure for preoperative evaluation/assessment.   Pre-surgical instructions were reviewed with the patient during his PAT appointment and questions were fielded by PAT clinical staff. Patient was advised that if any questions or concerns arise prior to his procedure then he should return a call to PAT and/or his surgeon's office to discuss.  Renate Caroline, MSN, APRN, FNP-C, CEN [facility]  Perioperative Services Nurse Practitioner Phone: [phone] 05/06/24 4:43 PM  NOTE: This note has been prepared using Dragon dictation software. Despite my best ability to proofread, there is always the  potential that unintentional transcriptional errors may still occur from this process.

## 2024-05-07 ENCOUNTER — Encounter: Payer: Self-pay | Admitting: Orthopedic Surgery

## 2024-05-07 ENCOUNTER — Encounter
Admission: RE | Disposition: A | Discharge status: Home Health | Payer: Self-pay | Source: Home / Self Care | Attending: Orthopedic Surgery

## 2024-05-07 ENCOUNTER — Ambulatory Visit: Payer: Self-pay | Admitting: Urgent Care

## 2024-05-07 ENCOUNTER — Ambulatory Visit
Admission: RE | Admit: 2024-05-07 | Discharge: 2024-05-08 | Disposition: A | Discharge status: Home Health | Attending: Orthopedic Surgery | Admitting: Orthopedic Surgery

## 2024-05-07 ENCOUNTER — Other Ambulatory Visit: Payer: Self-pay

## 2024-05-07 ENCOUNTER — Ambulatory Visit

## 2024-05-07 DIAGNOSIS — Z6835 Body mass index (BMI) 35.0-35.9, adult: Secondary | ICD-10-CM | POA: Insufficient documentation

## 2024-05-07 DIAGNOSIS — G894 Chronic pain syndrome: Secondary | ICD-10-CM | POA: Diagnosis not present

## 2024-05-07 DIAGNOSIS — M170 Bilateral primary osteoarthritis of knee: Secondary | ICD-10-CM | POA: Insufficient documentation

## 2024-05-07 DIAGNOSIS — F419 Anxiety disorder, unspecified: Secondary | ICD-10-CM | POA: Diagnosis not present

## 2024-05-07 DIAGNOSIS — M1990 Unspecified osteoarthritis, unspecified site: Secondary | ICD-10-CM | POA: Diagnosis not present

## 2024-05-07 DIAGNOSIS — Z96651 Presence of right artificial knee joint: Secondary | ICD-10-CM

## 2024-05-07 DIAGNOSIS — I119 Hypertensive heart disease without heart failure: Secondary | ICD-10-CM | POA: Diagnosis not present

## 2024-05-07 HISTORY — PX: TOTAL KNEE ARTHROPLASTY: SHX125

## 2024-05-07 SURGERY — ARTHROPLASTY, KNEE, TOTAL
Anesthesia: Spinal | Site: Knee | Laterality: Right

## 2024-05-07 MED ORDER — ACETAMINOPHEN 325 MG PO TABS: 325.0000 mg | ORAL_TABLET | Freq: Four times a day (QID) | ORAL | Status: DC | PRN

## 2024-05-07 MED ORDER — FENTANYL CITRATE (PF) 100 MCG/2ML IJ SOLN
INTRAMUSCULAR | Status: DC | PRN
  Administered 2024-05-07: 25 ug via INTRAVENOUS

## 2024-05-07 MED ORDER — CEFAZOLIN SODIUM-DEXTROSE 2-4 GM/100ML-% IV SOLN
INTRAVENOUS | Status: AC
  Filled 2024-05-07: qty 100

## 2024-05-07 MED ORDER — LIDOCAINE HCL (CARDIAC) PF 100 MG/5ML IV SOSY
PREFILLED_SYRINGE | INTRAVENOUS | Status: DC | PRN
  Administered 2024-05-07: 100 mg via INTRAVENOUS

## 2024-05-07 MED ORDER — DROPERIDOL 2.5 MG/ML IJ SOLN: 0.6250 mg | Freq: Once | INTRAMUSCULAR | Status: DC | PRN

## 2024-05-07 MED ORDER — BISOPROLOL-HYDROCHLOROTHIAZIDE 5-6.25 MG PO TABS: 1.0000 | ORAL_TABLET | Freq: Every day | ORAL | Status: DC

## 2024-05-07 MED ORDER — HYDROCHLOROTHIAZIDE 12.5 MG PO TABS
6.2500 mg | ORAL_TABLET | Freq: Every day | ORAL | Status: DC
  Filled 2024-05-07 (×2): qty 1

## 2024-05-07 MED ORDER — ENOXAPARIN SODIUM 30 MG/0.3ML IJ SOSY
30.0000 mg | PREFILLED_SYRINGE | INTRAMUSCULAR | Status: DC
  Administered 2024-05-08: 30 mg via SUBCUTANEOUS
  Filled 2024-05-07: qty 0.3

## 2024-05-07 MED ORDER — BUPIVACAINE HCL (PF) 0.5 % IJ SOLN
INTRAMUSCULAR | Status: DC | PRN
  Administered 2024-05-07: 2.8 mL via INTRATHECAL

## 2024-05-07 MED ORDER — METOCLOPRAMIDE HCL 10 MG PO TABS: 5.0000 mg | ORAL_TABLET | Freq: Three times a day (TID) | ORAL | Status: DC | PRN

## 2024-05-07 MED ORDER — CHLORHEXIDINE GLUCONATE 4 % EX SOLN: 1.0000 | CUTANEOUS | 1 refills | Status: DC

## 2024-05-07 MED ORDER — SODIUM CHLORIDE (PF) 0.9 % IJ SOLN
INTRAMUSCULAR | Status: AC
  Filled 2024-05-07: qty 20

## 2024-05-07 MED ORDER — ONDANSETRON HCL 4 MG PO TABS: 4.0000 mg | ORAL_TABLET | Freq: Four times a day (QID) | ORAL | Status: DC | PRN

## 2024-05-07 MED ORDER — BUPIVACAINE LIPOSOME 1.3 % IJ SUSP
INTRAMUSCULAR | Status: AC
  Filled 2024-05-07: qty 20

## 2024-05-07 MED ORDER — SURGIPHOR WOUND IRRIGATION SYSTEM - OPTIME: TOPICAL | Status: DC | PRN

## 2024-05-07 MED ORDER — CHLORHEXIDINE GLUCONATE 0.12 % MT SOLN
OROMUCOSAL | Status: AC
  Filled 2024-05-07: qty 15

## 2024-05-07 MED ORDER — TRAMADOL HCL 50 MG PO TABS
50.0000 mg | ORAL_TABLET | Freq: Four times a day (QID) | ORAL | Status: DC | PRN
  Administered 2024-05-08: 50 mg via ORAL
  Filled 2024-05-07: qty 1

## 2024-05-07 MED ORDER — CLONAZEPAM 0.5 MG PO TABS
ORAL_TABLET | ORAL | Status: AC
  Filled 2024-05-07: qty 2

## 2024-05-07 MED ORDER — KETOROLAC TROMETHAMINE 15 MG/ML IJ SOLN
7.5000 mg | Freq: Four times a day (QID) | INTRAMUSCULAR | Status: DC
  Administered 2024-05-07 – 2024-05-08 (×3): 7.5 mg via INTRAVENOUS
  Filled 2024-05-07 (×3): qty 1

## 2024-05-07 MED ORDER — PHENOL 1.4 % MT LIQD
1.0000 | OROMUCOSAL | Status: DC | PRN
Start: 2024-05-07 — End: 2024-05-08

## 2024-05-07 MED ORDER — ORAL CARE MOUTH RINSE: 15.0000 mL | Freq: Once | OROMUCOSAL | Status: AC

## 2024-05-07 MED ORDER — PHENYLEPHRINE HCL-NACL 20-0.9 MG/250ML-% IV SOLN
INTRAVENOUS | Status: DC | PRN
  Administered 2024-05-07: 50 ug/min via INTRAVENOUS

## 2024-05-07 MED ORDER — PHENYLEPHRINE HCL-NACL 20-0.9 MG/250ML-% IV SOLN
INTRAVENOUS | Status: AC
  Filled 2024-05-07: qty 250

## 2024-05-07 MED ORDER — LIDOCAINE HCL (PF) 2 % IJ SOLN
INTRAMUSCULAR | Status: AC
  Filled 2024-05-07: qty 5

## 2024-05-07 MED ORDER — VANCOMYCIN HCL IN DEXTROSE 1-5 GM/200ML-% IV SOLN
INTRAVENOUS | Status: AC
Start: 2024-05-07 — End: ?
  Filled 2024-05-07: qty 200

## 2024-05-07 MED ORDER — MUPIROCIN 2 % EX OINT: 1.0000 | TOPICAL_OINTMENT | Freq: Two times a day (BID) | CUTANEOUS | 0 refills | Status: AC

## 2024-05-07 MED ORDER — GLYCOPYRROLATE 0.2 MG/ML IJ SOLN
INTRAMUSCULAR | Status: AC
  Filled 2024-05-07: qty 1

## 2024-05-07 MED ORDER — METOCLOPRAMIDE HCL 5 MG/ML IJ SOLN: 5.0000 mg | Freq: Three times a day (TID) | INTRAMUSCULAR | Status: DC | PRN

## 2024-05-07 MED ORDER — LACTATED RINGERS IV SOLN: INTRAVENOUS | Status: DC

## 2024-05-07 MED ORDER — FENTANYL CITRATE (PF) 100 MCG/2ML IJ SOLN: 25.0000 ug | INTRAMUSCULAR | Status: DC | PRN

## 2024-05-07 MED ORDER — DEXAMETHASONE SODIUM PHOSPHATE 10 MG/ML IJ SOLN
8.0000 mg | Freq: Once | INTRAMUSCULAR | Status: AC
Start: 2024-05-07 — End: 2024-05-07
  Administered 2024-05-07: 8 mg via INTRAVENOUS

## 2024-05-07 MED ORDER — TRANEXAMIC ACID-NACL 1000-0.7 MG/100ML-% IV SOLN
1000.0000 mg | INTRAVENOUS | Status: AC
  Administered 2024-05-07 (×2): 1000 mg via INTRAVENOUS

## 2024-05-07 MED ORDER — MORPHINE SULFATE (PF) 2 MG/ML IV SOLN: 0.5000 mg | INTRAVENOUS | Status: DC | PRN

## 2024-05-07 MED ORDER — BISOPROLOL FUMARATE 5 MG PO TABS
5.0000 mg | ORAL_TABLET | Freq: Every day | ORAL | Status: DC
  Filled 2024-05-07 (×2): qty 1

## 2024-05-07 MED ORDER — OXYCODONE HCL 5 MG/5ML PO SOLN: 5.0000 mg | Freq: Once | ORAL | Status: DC | PRN

## 2024-05-07 MED ORDER — CLONAZEPAM 0.5 MG PO TABS
1.0000 mg | ORAL_TABLET | Freq: Every evening | ORAL | Status: DC | PRN
  Administered 2024-05-07: 1 mg via ORAL

## 2024-05-07 MED ORDER — TRANEXAMIC ACID-NACL 1000-0.7 MG/100ML-% IV SOLN
INTRAVENOUS | Status: AC
  Filled 2024-05-07: qty 100

## 2024-05-07 MED ORDER — PROPOFOL 1000 MG/100ML IV EMUL
INTRAVENOUS | Status: AC
Start: 2024-05-07 — End: ?
  Filled 2024-05-07: qty 100

## 2024-05-07 MED ORDER — DEXMEDETOMIDINE HCL IN NACL 80 MCG/20ML IV SOLN
INTRAVENOUS | Status: DC | PRN
  Administered 2024-05-07: 4 ug via INTRAVENOUS

## 2024-05-07 MED ORDER — SODIUM CHLORIDE (PF) 0.9 % IJ SOLN
INTRAMUSCULAR | Status: DC | PRN
  Administered 2024-05-07: 71 mL

## 2024-05-07 MED ORDER — OXYCODONE HCL 5 MG PO TABS: 5.0000 mg | ORAL_TABLET | Freq: Once | ORAL | Status: DC | PRN

## 2024-05-07 MED ORDER — HYDROCODONE-ACETAMINOPHEN 5-325 MG PO TABS
1.0000 | ORAL_TABLET | ORAL | Status: DC | PRN
  Administered 2024-05-07 – 2024-05-08 (×2): 1 via ORAL
  Filled 2024-05-07 (×2): qty 1

## 2024-05-07 MED ORDER — ONDANSETRON HCL 4 MG/2ML IJ SOLN
4.0000 mg | Freq: Four times a day (QID) | INTRAMUSCULAR | Status: DC | PRN
Start: 2024-05-07 — End: 2024-05-08

## 2024-05-07 MED ORDER — CHLORHEXIDINE GLUCONATE 0.12 % MT SOLN
15.0000 mL | Freq: Once | OROMUCOSAL | Status: AC
  Administered 2024-05-07: 15 mL via OROMUCOSAL

## 2024-05-07 MED ORDER — SODIUM CHLORIDE 0.9 % IR SOLN
Status: DC | PRN
  Administered 2024-05-07: 3000 mL

## 2024-05-07 MED ORDER — FENTANYL CITRATE (PF) 100 MCG/2ML IJ SOLN
INTRAMUSCULAR | Status: AC
  Filled 2024-05-07: qty 2

## 2024-05-07 MED ORDER — EPHEDRINE SULFATE-NACL 50-0.9 MG/10ML-% IV SOSY
PREFILLED_SYRINGE | INTRAVENOUS | Status: DC | PRN
  Administered 2024-05-07: 10 mg via INTRAVENOUS

## 2024-05-07 MED ORDER — PROPOFOL 10 MG/ML IV BOLUS
INTRAVENOUS | Status: DC | PRN
  Administered 2024-05-07: 100 ug/kg/min via INTRAVENOUS
  Administered 2024-05-07: 30 mg via INTRAVENOUS

## 2024-05-07 MED ORDER — DOCUSATE SODIUM 100 MG PO CAPS
100.0000 mg | ORAL_CAPSULE | Freq: Two times a day (BID) | ORAL | Status: DC
  Administered 2024-05-08: 100 mg via ORAL
  Filled 2024-05-07 (×2): qty 1

## 2024-05-07 MED ORDER — MENTHOL 3 MG MT LOZG: 1.0000 | LOZENGE | OROMUCOSAL | Status: DC | PRN

## 2024-05-07 MED ORDER — CEFAZOLIN SODIUM-DEXTROSE 2-4 GM/100ML-% IV SOLN
2.0000 g | INTRAVENOUS | Status: AC
  Administered 2024-05-07: 2 g via INTRAVENOUS

## 2024-05-07 MED ORDER — ACETAMINOPHEN 10 MG/ML IV SOLN
INTRAVENOUS | Status: AC
  Filled 2024-05-07: qty 100

## 2024-05-07 MED ORDER — TRANEXAMIC ACID-NACL 1000-0.7 MG/100ML-% IV SOLN
INTRAVENOUS | Status: AC
Start: 2024-05-07 — End: ?
  Filled 2024-05-07: qty 100

## 2024-05-07 MED ORDER — VANCOMYCIN HCL IN DEXTROSE 1-5 GM/200ML-% IV SOLN
1000.0000 mg | Freq: Once | INTRAVENOUS | Status: AC
Start: 2024-05-07 — End: 2024-05-07
  Administered 2024-05-07: 1000 mg via INTRAVENOUS

## 2024-05-07 MED ORDER — TERAZOSIN HCL 5 MG PO CAPS
5.0000 mg | ORAL_CAPSULE | Freq: Every evening | ORAL | Status: DC
  Administered 2024-05-07: 5 mg via ORAL
  Filled 2024-05-07 (×2): qty 1

## 2024-05-07 MED ORDER — ACETAMINOPHEN 500 MG PO TABS
1000.0000 mg | ORAL_TABLET | Freq: Three times a day (TID) | ORAL | Status: DC
  Administered 2024-05-08: 1000 mg via ORAL
  Filled 2024-05-07 (×2): qty 2

## 2024-05-07 MED ORDER — ACETAMINOPHEN 10 MG/ML IV SOLN: 1000.0000 mg | Freq: Once | INTRAVENOUS | Status: DC | PRN

## 2024-05-07 MED ORDER — ACETAMINOPHEN 10 MG/ML IV SOLN
INTRAVENOUS | Status: DC | PRN
  Administered 2024-05-07: 1000 mg via INTRAVENOUS

## 2024-05-07 MED ORDER — BUPIVACAINE-EPINEPHRINE (PF) 0.25% -1:200000 IJ SOLN
INTRAMUSCULAR | Status: AC
  Filled 2024-05-07: qty 30

## 2024-05-07 MED ORDER — PANTOPRAZOLE SODIUM 40 MG PO TBEC
40.0000 mg | DELAYED_RELEASE_TABLET | Freq: Every day | ORAL | Status: DC
Start: 2024-05-07 — End: 2024-05-08
  Administered 2024-05-07 – 2024-05-08 (×2): 40 mg via ORAL
  Filled 2024-05-07 (×2): qty 1

## 2024-05-07 MED ORDER — CEFAZOLIN SODIUM-DEXTROSE 2-4 GM/100ML-% IV SOLN
2.0000 g | Freq: Four times a day (QID) | INTRAVENOUS | Status: AC
  Administered 2024-05-07 – 2024-05-08 (×2): 2 g via INTRAVENOUS
  Filled 2024-05-07 (×2): qty 100

## 2024-05-07 MED ORDER — SODIUM CHLORIDE 0.9 % IV SOLN: INTRAVENOUS | Status: DC

## 2024-05-07 SURGICAL SUPPLY — 60 items
BLADE PATELLA W-PILOT HOLE 35 (BLADE) IMPLANT
BLADE SAW 90X13X1.19 OSCILLAT (BLADE) IMPLANT
BLADE SAW SAG 25X90X1.19 (BLADE) ×1 IMPLANT
BLADE SAW SAG 29X58X.64 (BLADE) ×1 IMPLANT
BNDG ELASTIC 6INX 5YD STR LF (GAUZE/BANDAGES/DRESSINGS) ×1 IMPLANT
BOWL CEMENT MIX W/ADAPTER (MISCELLANEOUS) IMPLANT
BRUSH SCRUB EZ PLAIN DRY (MISCELLANEOUS) IMPLANT
CEMENT BONE R 1X40 (Cement) IMPLANT
CHLORAPREP W/TINT 26 (MISCELLANEOUS) ×2 IMPLANT
COMPONENT FEM CMT STD PS 12 RT (Joint) IMPLANT
COOLER POLAR GLACIER W/PUMP (MISCELLANEOUS) ×1 IMPLANT
CUFF TRNQT CYL 24X4X16.5-23 (TOURNIQUET CUFF) IMPLANT
CUFF TRNQT CYL 30X4X21-28X (TOURNIQUET CUFF) IMPLANT
DERMABOND ADVANCED .7 DNX12 (GAUZE/BANDAGES/DRESSINGS) ×1 IMPLANT
DRAPE SHEET LG 3/4 BI-LAMINATE (DRAPES) ×2 IMPLANT
DRSG MEPILEX SACRM 8.7X9.8 (GAUZE/BANDAGES/DRESSINGS) ×1 IMPLANT
DRSG OPSITE POSTOP 4X10 (GAUZE/BANDAGES/DRESSINGS) IMPLANT
DRSG OPSITE POSTOP 4X8 (GAUZE/BANDAGES/DRESSINGS) IMPLANT
ELECTRODE REM PT RTRN 9FT ADLT (ELECTROSURGICAL) ×1 IMPLANT
GLOVE BIO SURGEON STRL SZ8 (GLOVE) ×1 IMPLANT
GLOVE BIOGEL PI IND STRL 8 (GLOVE) ×1 IMPLANT
GLOVE PI ORTHO PRO STRL 7.5 (GLOVE) ×2 IMPLANT
GLOVE PI ORTHO PRO STRL SZ8 (GLOVE) ×2 IMPLANT
GLOVE SURG SYN 7.5 E (GLOVE) ×1 IMPLANT
GLOVE SURG SYN 7.5 PF PI (GLOVE) ×1 IMPLANT
GOWN SRG XL LVL 3 NONREINFORCE (GOWNS) ×1 IMPLANT
GOWN STRL REUS W/ TWL LRG LVL3 (GOWN DISPOSABLE) ×1 IMPLANT
GOWN STRL REUS W/ TWL XL LVL3 (GOWN DISPOSABLE) ×1 IMPLANT
HOOD PEEL AWAY T7 (MISCELLANEOUS) ×2 IMPLANT
KIT TURNOVER KIT A (KITS) ×1 IMPLANT
KNEE SYSTEM MC 10 RT (Knees) IMPLANT
MANIFOLD NEPTUNE II (INSTRUMENTS) ×1 IMPLANT
MARKER SKIN DUAL TIP RULER LAB (MISCELLANEOUS) ×1 IMPLANT
MAT ABSORB FLUID 56X50 GRAY (MISCELLANEOUS) ×1 IMPLANT
NDL HYPO 21X1.5 SAFETY (NEEDLE) ×1 IMPLANT
NEEDLE HYPO 21X1.5 SAFETY (NEEDLE) ×1 IMPLANT
PACK TOTAL KNEE (MISCELLANEOUS) ×1 IMPLANT
PAD ARMBOARD POSITIONER FOAM (MISCELLANEOUS) ×3 IMPLANT
PAD WRAPON POLAR KNEE (MISCELLANEOUS) ×1 IMPLANT
PENCIL SMOKE EVACUATOR (MISCELLANEOUS) ×1 IMPLANT
PIN DRILL HDLS TROCAR 75 4PK (PIN) IMPLANT
SCREW FEMALE HEX FIX 25X2.5 (ORTHOPEDIC DISPOSABLE SUPPLIES) IMPLANT
SCREW HEX HEADED 3.5X27 DISP (ORTHOPEDIC DISPOSABLE SUPPLIES) IMPLANT
SLEEVE SCD COMPRESS KNEE MED (STOCKING) ×1 IMPLANT
SOL .9 NS 3000ML IRR UROMATIC (IV SOLUTION) ×1 IMPLANT
SOLUTION IRRIG SURGIPHOR (IV SOLUTION) ×1 IMPLANT
STEM POLY PAT PLY 38M KNEE (Knees) IMPLANT
STEM TIB ST PERS 14+30 (Stem) IMPLANT
STEM TIBIA 5 DEG SZ H R KNEE (Knees) IMPLANT
STOCKINETTE IMPERV 14X48 (MISCELLANEOUS) ×1 IMPLANT
SUT STRATAFIX 14 PDO 36 VLT (SUTURE) ×1 IMPLANT
SUT VIC AB 0 CT1 36 (SUTURE) ×1 IMPLANT
SUT VIC AB 2-0 CT2 27 (SUTURE) ×2 IMPLANT
SUTURE STRATA SPIR 4-0 18 (SUTURE) ×1 IMPLANT
SUTURE VICRYL 1-0 27IN ABS (SUTURE) ×1 IMPLANT
SYR 20ML LL LF (SYRINGE) ×2 IMPLANT
TAPE CLOTH 3X10 WHT NS LF (GAUZE/BANDAGES/DRESSINGS) ×1 IMPLANT
TIP FAN IRRIG PULSAVAC PLUS (DISPOSABLE) ×1 IMPLANT
TRAP FLUID SMOKE EVACUATOR (MISCELLANEOUS) ×1 IMPLANT
WATER STERILE IRR 1000ML POUR (IV SOLUTION) ×1 IMPLANT

## 2024-05-07 NOTE — Op Note (Signed)
 Patient Name: Renji Berwick  ZOX:[id number]  Pre-Operative Diagnosis: Right knee Osteoarthritis  Post-Operative Diagnosis: (same)  Procedure: Right Total Knee Arthroplasty  Components/Implants: Femur: Persona Size 12 CR   Tibia: Persona Size H w/ 14x55mm stem extension  Poly: 10mm MC  Patella: 38x9.5 Symmetric  Femoral Valgus Cut Angle: 5 degrees  Distal Femoral Re-cut: none  Patella Resurfacing: yes   Date of Surgery: 05/07/2024  Surgeon: Venus Ginsberg MD  Assistant: Francenia Ingle PA (present and scrubbed throughout the case, critical for assistance with exposure, retraction, instrumentation, and closure), Sarah PAs   Anesthesiologist: Lincoln Renshaw  Anesthesia: Spinal   Tourniquet Time: 93 min  EBL: 50cc  IVF: 600cc  Complications: None   Brief history: The patient is a 78 year old male with a history of osteoarthritis of the right knee with pain limiting their range of motion and activities of daily living, which has failed multiple attempts at conservative therapy.  The risks and benefits of total knee arthroplasty as definitive surgical treatment were discussed with the patient, who opted to proceed with the operation.  After outpatient medical clearance and optimization was completed the patient was admitted to [facility] for the procedure.  All preoperative films were reviewed and an appropriate surgical plan was made prior to surgery. Preoperative range of motion was 20 to 95 with a 20 deg flexion contracture.   Description of procedure: The patient was brought to the operating room where laterality was confirmed by all those present to be the right side.   Spinal anesthesia was administered and the patient received an intravenous dose of antibiotics for surgical prophylaxis and a dose of tranexamic acid.  Patient is positioned supine on the operating room table with all bony prominences well-padded.  A well-padded tourniquet was applied to the right  thigh.  The knee was then prepped and draped in usual sterile fashion with multiple layers of adhesive and nonadhesive drapes.  All of those present in the operating room participated in a surgical timeout laterality and patient were confirmed.   An Esmarch was wrapped around the extremity and the leg was elevated and the knee flexed.  The tourniquet was inflated to a pressure of 250 mmHg. The Esmarch was removed and the leg was brought down to full extension.  The patella and tibial tubercle identified and outlined using a marking pen and a midline skin incision was made with a knife carried through the subcutaneous tissue down to the extensor retinaculum.  After exposure of the extensor mechanism the medial parapatellar arthrotomy was performed with a scalpel and electrocautery extending down medial and distal to the tibial tubercle taking care to avoid incising the patellar tendon.   A standard medial release was performed over the proximal tibia.  The knee was brought into extension in order to excise the fat pad taking care not to damage the patella tendon.  The superior soft tissue was removed from the anterior surface of the distal femur to visualize for the procedure.  The knee was then brought into flexion with the patella subluxed laterally and subluxing the tibia anteriorly.  The ACL was transected and removed with electrocautery and additional soft tissue was removed from the proximal surface of the tibia to fully expose. The PCL was found to be intact and was preserved.  An extramedullary tibial cutting guide was then applied to the leg with a spring-loaded ankle clamp placed around the distal tibia just above the malleoli the angulation of the guide was adjusted to give  some posterior slope in the tibial resection with an appropriate varus/valgus alignment.  The resection guide was then pinned to the proximal tibia and the proximal tibial surface was resected with an oscillating saw.  Careful  attention was paid to ensure the blade did not disrupt any of the soft tissues including any lateral or medial ligament.  Attention was then turned to the femur, with the knee slightly flexed a opening drill was used to enter the medullary canal of the femur.  After removing the drill marrow was suctioned out to decompress the distal femur.  An intramedullary femoral guide was then inserted into the drill hole and the alignment guide was seated firmly against the distal end of the medial femoral condyle.  The distal femoral cutting guide was then attached and pinned securely to the anterior surface of the femur and the intramedullary rod and alignment guide was removed.  Distal femur resection was then performed with an oscillating saw with retractors protecting medial and laterally.   The distal cutting block was then removed and the extension gap was checked with a spacer.  Extension gap was found to be appropriately sized to accommodate the spacer block.   The femoral sizing guide was then placed securely into the posterior condyles of the femur and the femoral size was measured and determined to be 12.  The size 12; 4-in-1 cutting guide was placed in position and secured with 2 pins.  The anterior posterior and chamfer resections were then performed with an oscillating saw.  Bony fragments and osteophytes were then removed.  Using a lamina spreader the posterior medial and lateral condyles were checked for additional osteophytes and posterior soft tissue remnants.  Any remaining meniscus was removed at this time.  Periarticular injection was performed in the meniscal rims and posterior capsule with aspiration performed to ensure no intravascular injection.   The tibia was then exposed and the tibial trial was pinned onto the plateau after confirming appropriate orientation and rotation.  Using the drill bushing the tibia was prepared to the appropriate drill depth.  Tibial broach impactor was then driven  through the punch guide using a mallet.  The femoral trial component was then inserted onto the femur. A trial tibial polyethylene bearing was then placed and the knee was reduced.  The knee achieved full extension with no hyperextension and was found to be balanced in flexion and extension with the trials in place.  The knee was then brought into full extension the patella was everted and held with 2 Kocher clamps.  The articular surface of the patella was then resected with an patella reamer and saw after careful measurement with a caliper.  The patella was then prepared with the drill guide and a trial patella was placed.  The knee was then taken through range of motion and it was found that the patella articulated appropriately with the trochlea and good patellofemoral motion without subluxation.    The correct final components for implantation were confirmed and opened by the circulator nurse.  A bone plug was fashioned from the patient's bone cuts to plug the intramedullary femoral canal access hole and was tamped into place.  The prepared surfaces of the patella femur and tibia were cleaned with pulsatile lavage to remove all blood fat and other material and then the surfaces were dried.  3 bags of cement were mixed under vacuum and the components were cemented into place.  Excess cement was removed with curettes and forceps. A trial  polyethylene tibial component was placed and the knee was brought into extension to allow the cement to set.  At this time the periarticular injection cocktail was placed in the soft tissues surrounding the knee.  After full curing of the cement the balance of the knee was checked again and the final polyethylene size was confirmed. The tibial component was irrigated and locking mechanism checked to ensure it was clear of debris. The real polyethylene tibial component was implanted and the knee was brought through a range of motion.   The knee was then irrigated with copious  amount of normal saline via pulsatile lavage to remove all loose bodies and other debris.  The knee was then irrigated with surgiphor betadine based wash and reirrigated with saline.  The tourniquet was then dropped and all bleeding vessels were identified and coagulated.  The arthrotomy was approximated with #1 Vicryl and closed with #1 Stratafix suture.  The knee was brought into slight flexion and the subcutaneous tissues were closed with 0 Vicryl, 2-0 Vicryl and a running subcuticular 4-0 stratafix barbed suture.  Skin was then glued with Dermabond.  A sterile adhesive dressing was then placed along with a sequential compression device to the calf, a Ted stocking, and a cryotherapy cuff.   Sponge, needle, and Lap counts were all correct at the end of the case.   The patient was transferred off of the operating room table to a hospital bed, good pulses were found distally on the operative side.  The patient was transferred to the recovery room in stable condition.

## 2024-05-07 NOTE — Interval H&P Note (Signed)
Patient history and physical updated. Consent reviewed including risks, benefits, and alternatives to surgery. Patient agrees with above plan to proceed with right total knee arthroplasty  

## 2024-05-07 NOTE — Transfer of Care (Signed)
 Immediate Anesthesia Transfer of Care Note  Patient: Andrew MIKA Sr.  Procedure(s) Performed: ARTHROPLASTY, KNEE, TOTAL (Right: Knee)  Patient Location: PACU  Anesthesia Type:Spinal  Level of Consciousness: sedated  Airway & Oxygen Therapy: Patient Spontanous Breathing and Patient connected to face mask oxygen  Post-op Assessment: Report given to RN and Post -op Vital signs reviewed and stable  Post vital signs: Reviewed and stable  Last Vitals:  Vitals Value Taken Time  BP 86/53 05/07/24 1324  Temp 36.3 C 05/07/24 1324  Pulse 41 05/07/24 1328  Resp 13 05/07/24 1328  SpO2 97 % 05/07/24 1328  Vitals shown include unfiled device data.  Last Pain:  Vitals:   05/07/24 0911  TempSrc: Temporal  PainSc: 1          Complications: No notable events documented.

## 2024-05-07 NOTE — Anesthesia Procedure Notes (Signed)
 Spinal  Patient location during procedure: OR Start time: 05/07/2024 10:37 AM End time: 05/07/2024 10:42 AM Reason for block: surgical anesthesia Staffing Performed: anesthesiologist and resident/CRNA  Anesthesiologist: Baltazar Bonier, MD Resident/CRNA: Angelia Kelp, CRNA Performed by: Baltazar Bonier, MD Authorized by: Baltazar Bonier, MD   Preanesthetic Checklist Completed: patient identified, IV checked, site marked, risks and benefits discussed, surgical consent, monitors and equipment checked, pre-op evaluation and timeout performed Spinal Block Patient position: sitting Prep: DuraPrep Patient monitoring: heart rate, cardiac monitor, continuous pulse ox and blood pressure Approach: midline Location: L4-5 Injection technique: single-shot Needle Needle type: Pencan  Needle gauge: 25 G Needle length: 9 cm Assessment Sensory level: T10 Events: CSF return Additional Notes First attempt at L3/4 by CRNA Ronnell Coins. With no paresthesias. Second attempt by Rema Care. At same level with no paresthesias. Third attempt at L4/5 successful with no paresthesias.
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
## 2024-05-07 NOTE — Anesthesia Preprocedure Evaluation (Addendum)
 Anesthesia Evaluation  Patient identified by MRN, date of birth, ID band Patient awake    Reviewed: Allergy & Precautions, H&P , NPO status , Patient's Chart, lab work & pertinent test results  Airway Mallampati: III  TM Distance: >3 FB Neck ROM: full    Dental  (+) Chipped   Pulmonary neg shortness of breath, former smoker   Pulmonary exam normal        Cardiovascular Exercise Tolerance: Good hypertension, (-) angina (-) Orthopnea and (-) DOE Normal cardiovascular exam+ dysrhythmias (EKG SINUS BRADY)   TTE performed on 05/02/2024 revealed a normal left ventricular systolic function with an EF of >55%. There was mild apical LVH.  There were no regional wall motion abnormalities.  Left ventricular diastolic Doppler parameters were normal.  GLS -12.9%. Left atrium was mildly enlarged.  Right ventricular size and function normal with a TAPSE measuring 2.4 cm  (normal range >/= 1.6 cm).  RVSP = 33 mmHg.  There was trivial mitral and tricuspid valve regurgitation. All transvalvular gradients were noted to be normal providing no evidence suggestive of valvular stenosis. Aorta normal in size with no evidence of ectasia or aneurysmal dilatation.   Neuro/Psych  PSYCHIATRIC DISORDERS Anxiety     Chronic pain syndrome      GI/Hepatic Neg liver ROS,GERD  Controlled,,  Endo/Other  negative endocrine ROS    Renal/GU      Musculoskeletal  (+) Arthritis ,    Abdominal Normal abdominal exam  (+)   Peds  Hematology  (+) Blood dyscrasia thrombocytopenia   Anesthesia Other Findings Past Medical History: No date: Anxiety     Comment:  a.) on BZO (clonazepam) PRN No date: Arthritis No date: Back pain No date: Depression 12/18/2020: History of dysplastic nevus     Comment:  left upper back, mild No date: History of kidney stones No date: Hypertension No date: Internal hemorrhoids 04/24/2024: Nose colonized with MRSA     Comment:  a.)  presurgical PCR (+) 04/24/2024 prior to RIGHT TKA No date: Pneumonia No date: Reflux esophagitis No date: Thrombocytopenia (HCC) No date: Tubular adenoma  Past Surgical History: No date: COLONOSCOPY No date: KNEE SURGERY 01/09/2015: LUMBAR LAMINECTOMY/DECOMPRESSION MICRODISCECTOMY; Left     Comment:  Procedure: LUMBAR FOUR-FIVE LAMINECTOMY/DECOMPRESSION               MICRODISCECTOMY 1 LEVEL;  Surgeon: Audie Bleacher, MD;                Location: [facility];  Service: Neurosurgery;                Laterality: Left;  Left L45 microdiskectomy No date: VASECTOMY     Reproductive/Obstetrics negative OB ROS                             Anesthesia Physical Anesthesia Plan  ASA: 2  Anesthesia Plan: Spinal   Post-op Pain Management: Regional block*   Induction: Intravenous  PONV Risk Score and Plan: Propofol  infusion  Airway Management Planned: Natural Airway  Additional Equipment:   Intra-op Plan:   Post-operative Plan:   Informed Consent: I have reviewed the patients History and Physical, chart, labs and discussed the procedure including the risks, benefits and alternatives for the proposed anesthesia with the patient or authorized representative who has indicated his/her understanding and acceptance.     Dental Advisory Given  Plan Discussed with: CRNA and Surgeon  Anesthesia Plan Comments:  Anesthesia Quick Evaluation

## 2024-05-07 NOTE — Progress Notes (Signed)
 PT Cancellation Note  Patient Details Name: Andrew PUGLIA Sr. MRN: [id number] DOB: 1946/09/03   Cancelled Treatment:    Reason Eval/Treat Not Completed: Patient not medically ready: Pt currently lacking sufficient return of LE sensation/strength to safely participate with PT services.  Will attempt to see pt at a future date/time as medically appropriate.    Lavenia Post PT, DPT 05/07/24, 4:33 PM

## 2024-05-07 NOTE — Anesthesia Procedure Notes (Signed)
 Date/Time: 05/07/2024 10:43 AM  Performed by: Angelia Kelp, CRNAPre-anesthesia Checklist: Emergency Drugs available, Patient identified, Suction available, Patient being monitored and Timeout performed Patient Re-evaluated:Patient Re-evaluated prior to induction Oxygen Delivery Method: Simple face mask Preoxygenation: Pre-oxygenation with 100% oxygen Induction Type: IV induction

## 2024-05-07 NOTE — Plan of Care (Signed)
   Problem: Nutrition: Goal: Adequate nutrition will be maintained Outcome: Progressing   Problem: Pain Managment: Goal: General experience of comfort will improve and/or be controlled Outcome: Progressing   Problem: Safety: Goal: Ability to remain free from injury will improve Outcome: Progressing

## 2024-05-08 ENCOUNTER — Encounter: Payer: Self-pay | Admitting: Orthopedic Surgery

## 2024-05-08 DIAGNOSIS — M170 Bilateral primary osteoarthritis of knee: Secondary | ICD-10-CM | POA: Diagnosis not present

## 2024-05-08 LAB — BASIC METABOLIC PANEL WITH GFR
Anion gap: 9 (ref 5–15)
BUN: 21 mg/dL (ref 8–23)
CO2: 26 mmol/L (ref 22–32)
Calcium: 8.5 mg/dL — ABNORMAL LOW (ref 8.9–10.3)
Chloride: 99 mmol/L (ref 98–111)
Creatinine, Ser: 1 mg/dL (ref 0.61–1.24)
GFR, Estimated: >60 mL/min (ref >60)
Glucose, Bld: 162 mg/dL — ABNORMAL HIGH (ref 70–99)
Potassium: 3.6 mmol/L (ref 3.5–5.1)
Sodium: 134 mmol/L — ABNORMAL LOW (ref 135–145)

## 2024-05-08 LAB — CBC
HCT: 39.5 % (ref 39.0–52.0)
Hemoglobin: 13.1 g/dL (ref 13.0–17.0)
MCH: 29.4 pg (ref 26.0–34.0)
MCHC: 33.2 g/dL (ref 30.0–36.0)
MCV: 88.8 fL (ref 80.0–100.0)
Platelets: 127 10*3/uL — ABNORMAL LOW (ref 150–400)
RBC: 4.45 MIL/uL (ref 4.22–5.81)
RDW: 12.9 % (ref 11.5–15.5)
WBC: 15 10*3/uL — ABNORMAL HIGH (ref 4.0–10.5)
nRBC: 0 % (ref 0.0–0.2)

## 2024-05-08 MED ORDER — ACETAMINOPHEN 500 MG PO TABS: 1000.0000 mg | ORAL_TABLET | Freq: Three times a day (TID) | ORAL | 0 refills | Status: DC

## 2024-05-08 MED ORDER — ENOXAPARIN SODIUM 30 MG/0.3ML IJ SOSY: 30.0000 mg | PREFILLED_SYRINGE | INTRAMUSCULAR | 0 refills | Status: DC

## 2024-05-08 MED ORDER — ONDANSETRON HCL 4 MG PO TABS: 4.0000 mg | ORAL_TABLET | Freq: Four times a day (QID) | ORAL | 0 refills | Status: DC | PRN

## 2024-05-08 MED ORDER — OXYCODONE HCL 5 MG PO TABS: 2.5000 mg | ORAL_TABLET | Freq: Three times a day (TID) | ORAL | 0 refills | Status: DC | PRN

## 2024-05-08 MED ORDER — TRAMADOL HCL 50 MG PO TABS: 50.0000 mg | ORAL_TABLET | Freq: Four times a day (QID) | ORAL | 0 refills | Status: DC | PRN

## 2024-05-08 MED ORDER — DOCUSATE SODIUM 100 MG PO CAPS: 100.0000 mg | ORAL_CAPSULE | Freq: Two times a day (BID) | ORAL | 0 refills | Status: DC

## 2024-05-08 MED ORDER — CELECOXIB 100 MG PO CAPS: 100.0000 mg | ORAL_CAPSULE | Freq: Two times a day (BID) | ORAL | 0 refills | Status: AC

## 2024-05-08 NOTE — TOC Transition Note (Signed)
 Transition of Care [facility]) - Discharge Note   Patient Details  Name: Andrew Vasquez. MRN: [id number] Date of Birth: Jul 18, 1946  Transition of Care [facility]) CM/SW Contact:  Alexandra Ice, RN Phone Number: 05/08/2024, 9:51 AM   Clinical Narrative:    Patient to discharge today, home with home health services. Patient set up with [facility], notified Shaun of discharge today. No DME needs at this time.  Added agency information to AVS.   Final next level of care: Home w Home Health Services Barriers to Discharge: Barriers Resolved   Patient Goals and CMS Choice            Discharge Placement                  Name of family member notified: Dellar Fenton Patient and family notified of of transfer: 05/08/24  Discharge Plan and Services Additional resources added to the After Visit Summary for                    DME Agency: NA       HH Arranged: PT, OT HH Agency: [facility] Date HH Agency Contacted: 05/08/24 Time HH Agency Contacted: [phone] Representative spoke with at [facility] Agency: Shaun  Social Drivers of Health (SDOH) Interventions SDOH Screenings   Food Insecurity: No Food Insecurity (05/07/2024)  Housing: Low Risk  (05/07/2024)  Transportation Needs: No Transportation Needs (05/07/2024)  Utilities: Not At Risk (05/07/2024)  Financial Resource Strain: Low Risk  (04/27/2024)   Received from [facility]  Social Connections: Moderately Isolated (05/07/2024)  Tobacco Use: Medium Risk (05/07/2024)     Readmission Risk Interventions     No data to display

## 2024-05-08 NOTE — Anesthesia Postprocedure Evaluation (Signed)
 Anesthesia Post Note  Patient: Andrew Vasquez.  Procedure(s) Performed: ARTHROPLASTY, KNEE, TOTAL (Right: Knee)  Patient location during evaluation: Nursing Unit Anesthesia Type: Spinal Level of consciousness: oriented and awake and alert Pain management: pain level controlled Vital Signs Assessment: post-procedure vital signs reviewed and stable Respiratory status: spontaneous breathing and respiratory function stable Cardiovascular status: blood pressure returned to baseline and stable Postop Assessment: no headache, no backache, no apparent nausea or vomiting and patient able to bend at knees Anesthetic complications: no   No notable events documented.   Last Vitals:  Vitals:   05/08/24 0000 05/08/24 0413  BP: (!) 116/58 (!) 111/58  Pulse: (!) 58 (!) 56  Resp: 16 18  Temp: 36.6 C 36.6 C  SpO2: 96% 95%    Last Pain:  Vitals:   05/08/24 0511  TempSrc:   PainSc: 7                  Shaneil Yazdi

## 2024-05-08 NOTE — Progress Notes (Signed)
 DISCHARGE NOTE:   Pt dc with IV removed and pt given dc instructions. Pt has both TED hose on and in place. Pt given polar care and CHG soap. Pt and pt's son voice no questions or concerns at this time. Pt wheeled down to medical mall entrance by staff and pt's son provided transportation.

## 2024-05-08 NOTE — Discharge Instructions (Addendum)
 Instructions after Total Knee Replacement   Reinaldo Berber M.D.     Dept. of Orthopaedics & Sports Medicine  [facility]  [address]  [place], [place]  [address]  Phone: [phone]   Fax: [phone]    DIET: Drink plenty of non-alcoholic fluids. Resume your normal diet. Include foods high in fiber.  ACTIVITY:  You may use crutches or a walker with weight-bearing as tolerated, unless instructed otherwise. You may be weaned off of the walker or crutches by your Physical Therapist.  Do NOT place pillows under the knee. Anything placed under the knee could limit your ability to straighten the knee.   Continue doing gentle exercises. Exercising will reduce the pain and swelling, increase motion, and prevent muscle weakness.   Please continue to use the TED compression stockings for 2 weeks. You may remove the stockings at night, but should reapply them in the morning. Do not drive or operate any equipment until instructed.  WOUND CARE:  Continue to use the PolarCare or ice packs periodically to reduce pain and swelling. You may begin showering 3 days after surgery with honeycomb dressing. Remove honeycomb dressing 7 days after surgery and continue showering. Allow dermabond to fall off on its own.  MEDICATIONS: You may resume your regular medications. Please take the pain medication as prescribed on the medication. Do not take pain medication on an empty stomach. You have been given a prescription for a blood thinner (Lovenox or Coumadin). Please take the medication as instructed. (NOTE: After completing a 2 week course of Lovenox, take one 81 mg Enteric-coated aspirin twice a day for 3 additional weeks. This along with elevation will help reduce the possibility of phlebitis in your operated leg.) Do not drive or drink alcoholic beverages when taking pain medications.  POSTOPERATIVE CONSTIPATION PROTOCOL Constipation - defined medically as fewer than three stools per  week and severe constipation as less than one stool per week.  One of the most common issues patients have following surgery is constipation.  Even if you have a regular bowel pattern at home, your normal regimen is likely to be disrupted due to multiple reasons following surgery.  Combination of anesthesia, postoperative narcotics, change in appetite and fluid intake all can affect your bowels.  In order to avoid complications following surgery, here are some recommendations in order to help you during your recovery period.  Colace (docusate) - Pick up an over-the-counter form of Colace or another stool softener and take twice a day as long as you are requiring postoperative pain medications.  Take with a full glass of water daily.  If you experience loose stools or diarrhea, hold the colace until you stool forms back up.  If your symptoms do not get better within 1 week or if they get worse, check with your doctor.  Dulcolax (bisacodyl) - Pick up over-the-counter and take as directed by the product packaging as needed to assist with the movement of your bowels.  Take with a full glass of water.  Use this product as needed if not relieved by Colace only.   MiraLax (polyethylene glycol) - Pick up over-the-counter to have on hand.  MiraLax is a solution that will increase the amount of water in your bowels to assist with bowel movements.  Take as directed and can mix with a glass of water, juice, soda, coffee, or tea.  Take if you go more than two days without a movement. Do not use MiraLax more than once per day.  Call your doctor if you are still constipated or irregular after using this medication for 7 days in a row.  If you continue to have problems with postoperative constipation, please contact the office for further assistance and recommendations.  If you experience "the worst abdominal pain ever" or develop nausea or vomiting, please contact the office immediatly for further recommendations for  treatment.   CALL THE OFFICE FOR: Temperature above 101 degrees Excessive bleeding or drainage on the dressing. Excessive swelling, coldness, or paleness of the toes. Persistent nausea and vomiting.  FOLLOW-UP:  You should have an appointment to return to the office in 14 days after surgery. Arrangements have been made for continuation of Physical Therapy (either home therapy or outpatient therapy).   [facility] They will call you to set up when they are coming out to see you   [address], [place], [place] [address] Hours:  Open ? Closes 5?PM Phone: [phone]

## 2024-05-08 NOTE — Plan of Care (Signed)
   Problem: Clinical Measurements: Goal: Respiratory complications will improve Outcome: Progressing   Problem: Activity: Goal: Risk for activity intolerance will decrease Outcome: Progressing
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
## 2024-05-08 NOTE — Plan of Care (Signed)
   Problem: Health Behavior/Discharge Planning: Goal: Ability to manage health-related needs will improve Outcome: Progressing   Problem: Clinical Measurements: Goal: Ability to maintain clinical measurements within normal limits will improve Outcome: Progressing   Problem: Activity: Goal: Risk for activity intolerance will decrease Outcome: Progressing   Problem: Nutrition: Goal: Adequate nutrition will be maintained Outcome: Progressing

## 2024-05-08 NOTE — Progress Notes (Signed)
   Subjective: 1 Day Post-Op Procedure(s) (LRB): ARTHROPLASTY, KNEE, TOTAL (Right) Patient reports pain as mild.   Patient is well, and has had no acute complaints or problems Denies any CP, SOB, ABD pain. We will continue therapy today.  Plan is to go Home after hospital stay.  Objective: Vital signs in last 24 hours: Temp:  [96.8 F (36 C)-98 F (36.7 C)] 97.8 F (36.6 C) (06/03 0756) Pulse Rate:  [39-73] 60 (06/03 0756) Resp:  [6-18] 15 (06/03 0756) BP: (86-165)/(50-85) 110/50 (06/03 0756) SpO2:  [93 %-100 %] 96 % (06/03 0756) Weight:  [101.6 kg] 101.6 kg (06/02 0911)  Intake/Output from previous day: 06/02 0701 - 06/03 0700 In: 2670.2 [P.O.:440; I.V.:1632.7; IV Piggyback:597.6] Out: 1400 [Urine:1350; Blood:50] Intake/Output this shift: No intake/output data recorded.  Recent Labs    05/08/24 0537  HGB 13.1   Recent Labs    05/08/24 0537  WBC 15.0*  RBC 4.45  HCT 39.5  PLT 127*   Recent Labs    05/08/24 0537  NA 134*  K 3.6  CL 99  CO2 26  BUN 21  CREATININE 1.00  GLUCOSE 162*  CALCIUM 8.5*   No results for input(s): "LABPT", "INR" in the last 72 hours.  EXAM General - Patient is Alert, Appropriate, and Oriented Extremity - Neurovascular intact Sensation intact distally Intact pulses distally Dorsiflexion/Plantar flexion intact Dressing - dressing C/D/I and no drainage Motor Function - intact, moving foot and toes well on exam.   Past Medical History:  Diagnosis Date   Anxiety    a.) on BZO (clonazepam) PRN   Arthritis    Back pain    Depression    History of dysplastic nevus 12/18/2020   left upper back, mild   History of kidney stones    Hypertension    Internal hemorrhoids    Nose colonized with MRSA 04/24/2024   a.) presurgical PCR (+) 04/24/2024 prior to RIGHT TKA   Pneumonia    Reflux esophagitis    Thrombocytopenia (HCC)    Tubular adenoma     Assessment/Plan:   1 Day Post-Op Procedure(s) (LRB): ARTHROPLASTY, KNEE, TOTAL  (Right) Principal Problem:   S/P total knee arthroplasty, right  Estimated body mass index is 29.55 kg/m as calculated from the following:   Height as of this encounter: 6\' 1"  (1.854 m).   Weight as of this encounter: 101.6 kg. Advance diet Up with therapy Pain well controlled Labs and VSS CM to assist with discharge to home with HHPT  DVT Prophylaxis - Lovenox, TED hose, and SCDs Weight-Bearing as tolerated to right leg   T. Thomos Flies, PA-C [facility] 05/08/2024, 8:20 AM

## 2024-06-22 ENCOUNTER — Emergency Department

## 2024-06-22 ENCOUNTER — Other Ambulatory Visit: Payer: Self-pay

## 2024-06-22 ENCOUNTER — Inpatient Hospital Stay
Admission: EM | Admit: 2024-06-22 | Discharge: 2024-06-27 | DRG: 481 | Disposition: A | Discharge status: Home Health | Attending: Internal Medicine | Admitting: Internal Medicine

## 2024-06-22 DIAGNOSIS — Y92009 Unspecified place in unspecified non-institutional (private) residence as the place of occurrence of the external cause: Secondary | ICD-10-CM | POA: Diagnosis not present

## 2024-06-22 DIAGNOSIS — W07XXXA Fall from chair, initial encounter: Secondary | ICD-10-CM | POA: Diagnosis present

## 2024-06-22 DIAGNOSIS — I10 Essential (primary) hypertension: Secondary | ICD-10-CM | POA: Diagnosis present

## 2024-06-22 DIAGNOSIS — Z882 Allergy status to sulfonamides status: Secondary | ICD-10-CM | POA: Diagnosis not present

## 2024-06-22 DIAGNOSIS — E876 Hypokalemia: Secondary | ICD-10-CM | POA: Diagnosis not present

## 2024-06-22 DIAGNOSIS — Z5986 Financial insecurity: Secondary | ICD-10-CM

## 2024-06-22 DIAGNOSIS — F419 Anxiety disorder, unspecified: Secondary | ICD-10-CM | POA: Diagnosis present

## 2024-06-22 DIAGNOSIS — Z9852 Vasectomy status: Secondary | ICD-10-CM

## 2024-06-22 DIAGNOSIS — S72144A Nondisplaced intertrochanteric fracture of right femur, initial encounter for closed fracture: Secondary | ICD-10-CM | POA: Diagnosis present

## 2024-06-22 DIAGNOSIS — K5909 Other constipation: Secondary | ICD-10-CM | POA: Diagnosis not present

## 2024-06-22 DIAGNOSIS — E669 Obesity, unspecified: Secondary | ICD-10-CM

## 2024-06-22 DIAGNOSIS — S72001A Fracture of unspecified part of neck of right femur, initial encounter for closed fracture: Secondary | ICD-10-CM | POA: Diagnosis present

## 2024-06-22 DIAGNOSIS — D62 Acute posthemorrhagic anemia: Secondary | ICD-10-CM | POA: Diagnosis not present

## 2024-06-22 DIAGNOSIS — Z96651 Presence of right artificial knee joint: Secondary | ICD-10-CM | POA: Diagnosis present

## 2024-06-22 DIAGNOSIS — K5900 Constipation, unspecified: Secondary | ICD-10-CM | POA: Diagnosis present

## 2024-06-22 DIAGNOSIS — E66811 Obesity, class 1: Secondary | ICD-10-CM | POA: Diagnosis present

## 2024-06-22 DIAGNOSIS — W19XXXA Unspecified fall, initial encounter: Secondary | ICD-10-CM | POA: Diagnosis not present

## 2024-06-22 DIAGNOSIS — Z8249 Family history of ischemic heart disease and other diseases of the circulatory system: Secondary | ICD-10-CM | POA: Diagnosis not present

## 2024-06-22 DIAGNOSIS — Z87891 Personal history of nicotine dependence: Secondary | ICD-10-CM | POA: Diagnosis not present

## 2024-06-22 DIAGNOSIS — D696 Thrombocytopenia, unspecified: Secondary | ICD-10-CM | POA: Diagnosis present

## 2024-06-22 DIAGNOSIS — Z87442 Personal history of urinary calculi: Secondary | ICD-10-CM | POA: Diagnosis not present

## 2024-06-22 DIAGNOSIS — Z6832 Body mass index (BMI) 32.0-32.9, adult: Secondary | ICD-10-CM

## 2024-06-22 DIAGNOSIS — I4510 Unspecified right bundle-branch block: Secondary | ICD-10-CM | POA: Diagnosis present

## 2024-06-22 DIAGNOSIS — Z79899 Other long term (current) drug therapy: Secondary | ICD-10-CM

## 2024-06-22 LAB — CBC WITH DIFFERENTIAL/PLATELET
Abs Immature Granulocytes: 0.04 K/uL (ref 0.00–0.07)
Basophils Absolute: 0 K/uL (ref 0.0–0.1)
Basophils Relative: 1 %
Eosinophils Absolute: 0.1 K/uL (ref 0.0–0.5)
Eosinophils Relative: 1 %
HCT: 39.1 % (ref 39.0–52.0)
Hemoglobin: 12.5 g/dL — ABNORMAL LOW (ref 13.0–17.0)
Immature Granulocytes: 1 %
Lymphocytes Relative: 19 %
Lymphs Abs: 1.6 K/uL (ref 0.7–4.0)
MCH: 30.3 pg (ref 26.0–34.0)
MCHC: 32 g/dL (ref 30.0–36.0)
MCV: 94.7 fL (ref 80.0–100.0)
Monocytes Absolute: 0.7 K/uL (ref 0.1–1.0)
Monocytes Relative: 8 %
Neutro Abs: 6 K/uL (ref 1.7–7.7)
Neutrophils Relative %: 70 %
Platelets: 93 K/uL — ABNORMAL LOW (ref 150–400)
RBC: 4.13 MIL/uL — ABNORMAL LOW (ref 4.22–5.81)
RDW: 13 % (ref 11.5–15.5)
WBC: 8.5 K/uL (ref 4.0–10.5)
nRBC: 0 % (ref 0.0–0.2)

## 2024-06-22 LAB — BASIC METABOLIC PANEL WITH GFR
Anion gap: 12 (ref 5–15)
BUN: 20 mg/dL (ref 8–23)
CO2: 26 mmol/L (ref 22–32)
Calcium: 9 mg/dL (ref 8.9–10.3)
Chloride: 101 mmol/L (ref 98–111)
Creatinine, Ser: 1.11 mg/dL (ref 0.61–1.24)
GFR, Estimated: >60 mL/min (ref >60)
Glucose, Bld: 140 mg/dL — ABNORMAL HIGH (ref 70–99)
Potassium: 3.5 mmol/L (ref 3.5–5.1)
Sodium: 139 mmol/L (ref 135–145)

## 2024-06-22 MED ORDER — DOCUSATE SODIUM 100 MG PO CAPS
100.0000 mg | ORAL_CAPSULE | Freq: Two times a day (BID) | ORAL | Status: DC
Start: 2024-06-22 — End: 2024-06-26
  Administered 2024-06-23 – 2024-06-24 (×3): 100 mg via ORAL
  Filled 2024-06-22 (×5): qty 1

## 2024-06-22 MED ORDER — POTASSIUM CHLORIDE IN NACL 20-0.9 MEQ/L-% IV SOLN
INTRAVENOUS | Status: AC
  Filled 2024-06-22 (×5): qty 1000

## 2024-06-22 MED ORDER — HYDROMORPHONE HCL 1 MG/ML IJ SOLN
0.5000 mg | Freq: Once | INTRAMUSCULAR | Status: AC
  Administered 2024-06-22: 0.5 mg via INTRAVENOUS
  Filled 2024-06-22: qty 0.5

## 2024-06-22 MED ORDER — TERAZOSIN HCL 5 MG PO CAPS
5.0000 mg | ORAL_CAPSULE | Freq: Every evening | ORAL | Status: DC
  Filled 2024-06-22 (×2): qty 1

## 2024-06-22 MED ORDER — HYDROCODONE-ACETAMINOPHEN 5-325 MG PO TABS
1.0000 | ORAL_TABLET | Freq: Four times a day (QID) | ORAL | Status: DC | PRN
  Administered 2024-06-23: 2 via ORAL
  Filled 2024-06-22: qty 2

## 2024-06-22 MED ORDER — GABAPENTIN 100 MG PO CAPS
200.0000 mg | ORAL_CAPSULE | Freq: Two times a day (BID) | ORAL | Status: DC
  Administered 2024-06-23: 200 mg via ORAL
  Filled 2024-06-22 (×2): qty 2

## 2024-06-22 MED ORDER — MAGNESIUM HYDROXIDE 400 MG/5ML PO SUSP: 30.0000 mL | Freq: Every day | ORAL | Status: DC | PRN

## 2024-06-22 MED ORDER — MORPHINE SULFATE (PF) 2 MG/ML IV SOLN: 0.5000 mg | INTRAVENOUS | Status: DC | PRN

## 2024-06-22 MED ORDER — MORPHINE SULFATE (PF) 2 MG/ML IV SOLN
1.0000 mg | INTRAVENOUS | Status: DC | PRN
  Administered 2024-06-23 (×2): 1 mg via INTRAVENOUS
  Filled 2024-06-22 (×2): qty 1

## 2024-06-22 MED ORDER — CHLORHEXIDINE GLUCONATE 4 % EX SOLN: Freq: Once | CUTANEOUS | Status: AC

## 2024-06-22 MED ORDER — TRANEXAMIC ACID-NACL 1000-0.7 MG/100ML-% IV SOLN
1000.0000 mg | INTRAVENOUS | Status: AC
  Administered 2024-06-23: 1000 mg via INTRAVENOUS
  Filled 2024-06-22: qty 100

## 2024-06-22 MED ORDER — ACETAMINOPHEN 325 MG PO TABS: 650.0000 mg | ORAL_TABLET | ORAL | Status: DC | PRN

## 2024-06-22 MED ORDER — TRAZODONE HCL 50 MG PO TABS
25.0000 mg | ORAL_TABLET | Freq: Every evening | ORAL | Status: DC | PRN
  Administered 2024-06-24: 25 mg via ORAL
  Filled 2024-06-22: qty 1

## 2024-06-22 MED ORDER — ONDANSETRON HCL 4 MG/2ML IJ SOLN
4.0000 mg | INTRAMUSCULAR | Status: DC | PRN
  Administered 2024-06-23 (×2): 4 mg via INTRAVENOUS
  Filled 2024-06-22 (×2): qty 2

## 2024-06-22 MED ORDER — DIPHENHYDRAMINE HCL 50 MG/ML IJ SOLN
25.0000 mg | Freq: Once | INTRAMUSCULAR | Status: AC
  Administered 2024-06-22: 25 mg via INTRAVENOUS
  Filled 2024-06-22: qty 1

## 2024-06-22 MED ORDER — BISOPROLOL-HYDROCHLOROTHIAZIDE 5-6.25 MG PO TABS
1.0000 | ORAL_TABLET | Freq: Every day | ORAL | Status: DC
  Filled 2024-06-22: qty 1

## 2024-06-22 MED ORDER — SODIUM CHLORIDE 0.9 % IV SOLN: INTRAVENOUS | Status: DC

## 2024-06-22 MED ORDER — CEFAZOLIN SODIUM-DEXTROSE 2-4 GM/100ML-% IV SOLN
2.0000 g | INTRAVENOUS | Status: AC
  Administered 2024-06-23: 2 g via INTRAVENOUS

## 2024-06-22 MED ORDER — MUPIROCIN 2 % EX OINT: TOPICAL_OINTMENT | Freq: Two times a day (BID) | CUTANEOUS | Status: DC

## 2024-06-22 MED ORDER — CLONAZEPAM 1 MG PO TABS: 1.0000 mg | ORAL_TABLET | Freq: Every evening | ORAL | Status: DC | PRN

## 2024-06-22 MED ORDER — CELECOXIB 200 MG PO CAPS
200.0000 mg | ORAL_CAPSULE | Freq: Two times a day (BID) | ORAL | Status: AC
  Filled 2024-06-22: qty 1

## 2024-06-22 NOTE — H&P (Incomplete)
 [place]   PATIENT NAME: Andrew Vasquez    MR#:  [id number]  DATE OF BIRTH:  24-Jan-1946  DATE OF ADMISSION:  06/22/2024  PRIMARY CARE PHYSICIAN: Auston Reyes BIRCH, MD   Patient is coming from: ***  REQUESTING/REFERRING PHYSICIAN: ***  CHIEF COMPLAINT:   Chief Complaint  Patient presents with   Fall    HISTORY OF PRESENT ILLNESS:  Andrew Vasquez. is a 78 y.o. 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   a.) presurgical PCR (+) 04/24/2024 prior to RIGHT TKA   Pneumonia    Reflux esophagitis    Thrombocytopenia (HCC)    Tubular adenoma     PAST SURGICAL HISTORY:   Past Surgical History:  Procedure Laterality Date   COLONOSCOPY     KNEE SURGERY     LUMBAR LAMINECTOMY/DECOMPRESSION MICRODISCECTOMY Left 01/09/2015   Procedure: LUMBAR FOUR-FIVE LAMINECTOMY/DECOMPRESSION MICRODISCECTOMY 1 LEVEL;  Surgeon: Rockey Peru, MD;  Location: [facility];  Service: Neurosurgery;  Laterality: Left;  Left L45 microdiskectomy   TOTAL KNEE ARTHROPLASTY Right 05/07/2024   Procedure: ARTHROPLASTY, KNEE, TOTAL;  Surgeon: Lorelle Hussar, MD;  Location: [facility];  Service: Orthopedics;  Laterality: Right;   VASECTOMY      SOCIAL HISTORY:   Social History   Tobacco Use   Smoking status: Former   Smokeless tobacco: Not on file  Substance Use Topics   Alcohol use: No    FAMILY HISTORY:  No family history on file.  DRUG ALLERGIES:   Allergies  Allergen Reactions   Sulfa Antibiotics Swelling    Other: confusion    REVIEW OF SYSTEMS:   ROS As per history of present illness. All  pertinent systems were reviewed above. Constitutional, HEENT, cardiovascular, respiratory, GI, GU, musculoskeletal, neuro, psychiatric, endocrine, integumentary and hematologic systems were reviewed and are otherwise negative/unremarkable except for positive findings mentioned above in the HPI.   MEDICATIONS AT HOME:   Prior to Admission medications   Medication Sig Start Date End Date Taking? Authorizing Provider  acetaminophen  (TYLENOL ) 500 MG tablet Take 2 tablets (1,000 mg total) by mouth every 8 (eight) hours. 05/08/24   Charlene Debby BROCKS, PA-C  bisoprolol -hydrochlorothiazide  (ZIAC ) 5-6.25 MG tablet Take 1 tablet by mouth daily. 09/24/21   [provider]  chlorhexidine  (HIBICLENS ) 4 % external liquid Apply 15 mLs (1 Application total) topically as directed for 30 doses. Use as directed daily for 5 days every other week for 6 weeks. 05/07/24   Aberman, Zachary, MD  clonazePAM  (KLONOPIN ) 1 MG tablet Take 1 mg by mouth at bedtime as needed (sleep). 04/11/24   [provider]  docusate sodium  (COLACE) 100 MG capsule Take 1 capsule (100 mg total) by mouth 2 (two) times daily. 05/08/24   Charlene Debby BROCKS, PA-C  enoxaparin  (LOVENOX ) 30 MG/0.3ML injection Inject 0.3 mLs (30 mg total) into the skin daily for 14 days. 05/09/24 05/23/24  Charlene Debby BROCKS, PA-C  mupirocin  ointment (BACTROBAN ) 2 % Apply small amount to the inside of  both nostrils TWICE a day for the next 5 days. 04/24/24   Gray, Bryan E, NP  ondansetron  (ZOFRAN ) 4 MG tablet Take 1 tablet (4 mg total) by mouth every 6 (six) hours as needed for nausea. 05/08/24   Charlene Debby BROCKS, PA-C  oxyCODONE  (ROXICODONE ) 5 MG immediate release tablet Take 0.5 tablets (2.5 mg total) by mouth every 8 (eight) hours as needed for breakthrough pain. 05/08/24 05/08/25  Charlene Debby BROCKS, PA-C  terazosin  (HYTRIN ) 5 MG capsule Take 5 mg by mouth every evening. 09/24/21 04/18/27  [provider]  traMADol  (ULTRAM ) 50 MG tablet Take 1 tablet (50 mg total)  by mouth every 6 (six) hours as needed for moderate pain (pain score 4-6). 05/08/24   Charlene Debby BROCKS, PA-C      VITAL SIGNS:  Blood pressure (!) 179/66, pulse (!) 56, temperature 98.3 F (36.8 C), temperature source Oral, resp. rate 18, height 6' (1.829 m), weight 107.7 kg, SpO2 100%.  PHYSICAL EXAMINATION:  Physical Exam  GENERAL:  78 y.o.-year-old patient lying in the bed with no acute distress.  EYES: Pupils equal, round, reactive to light and accommodation. No scleral icterus. Extraocular muscles intact.  HEENT: Head atraumatic, normocephalic. Oropharynx and nasopharynx clear.  NECK:  Supple, no jugular venous distention. No thyroid enlargement, no tenderness.  LUNGS: Normal breath sounds bilaterally, no wheezing, rales,rhonchi or crepitation. No use of accessory muscles of respiration.  CARDIOVASCULAR: Regular rate and rhythm, S1, S2 normal. No murmurs, rubs, or gallops.  ABDOMEN: Soft, nondistended, nontender. Bowel sounds present. No organomegaly or mass.  EXTREMITIES: No pedal edema, cyanosis, or clubbing.  NEUROLOGIC: Cranial nerves II through XII are intact. Muscle strength 5/5 in all extremities. Sensation intact. Gait not checked.  PSYCHIATRIC: The patient is alert and oriented x 3.  Normal affect and good eye contact. SKIN: No obvious rash, lesion, or ulcer.   LABORATORY PANEL:   CBC Recent Labs  Lab 06/22/24 2158  WBC 8.5  HGB 12.5*  HCT 39.1  PLT 93*   ------------------------------------------------------------------------------------------------------------------  Chemistries  Recent Labs  Lab 06/22/24 2158  NA 139  K 3.5  CL 101  CO2 26  GLUCOSE 140*  BUN 20  CREATININE 1.11  CALCIUM 9.0   ------------------------------------------------------------------------------------------------------------------  Cardiac Enzymes No results for input(s): TROPONINI in the last 168  hours. ------------------------------------------------------------------------------------------------------------------  RADIOLOGY:  DG Chest 1 View Result Date: 06/22/2024 CLINICAL DATA:  Status post fall. EXAM: CHEST  1 VIEW COMPARISON:  August 02, 2010 FINDINGS: The heart size and mediastinal contours are within normal limits. No acute infiltrate, pleural effusion or pneumothorax is identified. Multilevel degenerative changes are seen throughout the thoracic spine. IMPRESSION: No active cardiopulmonary disease. Electronically Signed   By: Suzen Dials M.D.   On: 06/22/2024 22:01   DG HIP UNILAT WITH PELVIS 2-3 VIEWS RIGHT Result Date: 06/22/2024 CLINICAL DATA:  Status post fall. EXAM: DG HIP (WITH OR WITHOUT PELVIS) 2-3V RIGHT COMPARISON:  None Available. FINDINGS: An acute, comminuted fracture deformity is seen extending through the inter trochanteric region of the proximal right femur. A chronic deformity of the left inferior pubic ramus is suspected. There is no evidence of dislocation. Degenerative changes are seen in the form of joint space narrowing and acetabular sclerosis. IMPRESSION: Acute, comminuted fracture of the proximal right femur. Electronically Signed   By: Suzen Dials M.D.   On: 06/22/2024 21:59      IMPRESSION AND PLAN:  Assessment and Plan: No notes have been filed under this hospital service. Service:  Hospitalist      DVT prophylaxis: Lovenox ***  Advanced Care Planning:  Code Status: full code***  Family Communication:  The plan of care was discussed in details with the patient (and family). I answered all questions. The patient agreed to proceed with the above mentioned plan. Further management will depend upon hospital course. Disposition Plan: Back to previous home environment Consults called: none***  All the records are reviewed and case discussed with ED provider.  Status is: Inpatient {Inpatient:23812}   At the time of the admission, it  appears that the appropriate admission status for this patient is inpatient.  This is judged to be reasonable and necessary in order to provide the required intensity of service to ensure the patient's safety given the presenting symptoms, physical exam findings and initial radiographic and laboratory data in the context of comorbid conditions.  The patient requires inpatient status due to high intensity of service, high risk of further deterioration and high frequency of surveillance required.  I certify that at the time of admission, it is my clinical judgment that the patient will require inpatient hospital care extending more than 2 midnights.                            Dispo: The patient is from: Home              Anticipated d/c is to: Home              Patient currently is not medically stable to d/c.              Difficult to place patient: No  Madison DELENA Peaches M.D on 06/22/2024 at 11:30 PM  [facility]   From 7 PM-7 AM, contact night-coverage [email or web address]  CC: Primary care physician; Auston Reyes BIRCH, MD

## 2024-06-22 NOTE — ED Provider Notes (Signed)
 [facility] Provider Note    Event Date/Time   First MD Initiated Contact with Patient 06/22/24 2110     (approximate)   History   Fall   HPI  Andrew Vasquez. is a 78 y.o. male with a history of hypertension, thrombocytopenia, depression, and back pain, as well as right total knee arthroplasty last month, who presents with a right hip injury after a fall.  The patient states that he was sitting on his chair trying to turn on a piece of computer equipment when the chair slipped out from under him, causing him to fall onto his right hip.  He is not able to bear weight.  He denies hitting his head and did not lose consciousness.  He has no other injuries.  He states that he does have some pain to his tailbone but had a previous fracture there.  I reviewed the past medical records.  The patient was just admitted to orthopedics early last month after a total knee arthroplasty on the right on 6/2.   Physical Exam   Triage Vital Signs: ED Triage Vitals  Encounter Vitals Group     BP 06/22/24 2105 (!) 179/66     Girls Systolic BP Percentile --      Girls Diastolic BP Percentile --      Boys Systolic BP Percentile --      Boys Diastolic BP Percentile --      Pulse Rate 06/22/24 2105 (!) 56     Resp 06/22/24 2105 18     Temp 06/22/24 2105 98.3 F (36.8 C)     Temp Source 06/22/24 2105 Oral     SpO2 06/22/24 2105 100 %     Weight 06/22/24 2108 237 lb 8 oz (107.7 kg)     Height 06/22/24 2108 6' (1.829 m)     Head Circumference --      Peak Flow --      Pain Score 06/22/24 2108 8     Pain Loc --      Pain Education --      Exclude from Growth Chart --     Most recent vital signs: Vitals:   06/22/24 2105  BP: (!) 179/66  Pulse: (!) 56  Resp: 18  Temp: 98.3 F (36.8 C)  SpO2: 100%     General: Awake, no distress.  CV:  Good peripheral perfusion.  Resp:  Normal effort.  Abd:  No distention.  Other:  Right leg appears slightly shortened.  Pain  on any range of motion of the right hip.  Lateral right hip tenderness.  2+ DP pulse to the right foot.  Motor and sensory intact distally.   ED Results / Procedures / Treatments   Labs (all labs ordered are listed, but only abnormal results are displayed) Labs Reviewed  BASIC METABOLIC PANEL WITH GFR - Abnormal; Notable for the following components:      Result Value   Glucose, Bld 140 (*)    All other components within normal limits  CBC WITH DIFFERENTIAL/PLATELET - Abnormal; Notable for the following components:   RBC 4.13 (*)    Hemoglobin 12.5 (*)    Platelets 93 (*)    All other components within normal limits     EKG     RADIOLOGY  XR R hip: I independently viewed and interpreted the images; there is an intertrochanteric fracture  Chest x-ray: No focal consolidation or edema   PROCEDURES:  Critical Care performed:  No  Procedures   MEDICATIONS ORDERED IN ED: Medications  HYDROmorphone  (DILAUDID ) injection 0.5 mg (0.5 mg Intravenous Given 06/22/24 2213)  diphenhydrAMINE (BENADRYL) injection 25 mg (25 mg Intravenous Given 06/22/24 2213)     IMPRESSION / MDM / ASSESSMENT AND PLAN / ED COURSE  I reviewed the triage vital signs and the nursing notes.  78 year old male with PMH as noted above presents with right hip pain after mechanical fall out of a chair.  On exam he has difficulty with any range of motion of the right hip although the leg does not appear significantly shortened.  The right lower extremity is neuro/vascular intact.  Differential diagnosis includes, but is not limited to, hip contusion, fracture, muscle strain or spasm.  The patient has some mild tailbone pain as well but had a previous injury there.  It seems that the main focus of pain is the right hip.  Patient's presentation is most consistent with acute presentation with potential threat to life or bodily function.  ----------------------------------------- 11:11 PM on  06/22/2024 -----------------------------------------  X-rays reveal a right intertrochanteric fracture.  I consulted Dr. Cleotilde from orthopedics who will plan for surgery tomorrow.  BMP and CBC show no acute findings.  The patient is not on anticoagulation.  I consulted Dr. Lawence from the hospitalist service; based on our discussion he agrees to evaluate the patient for admission.   FINAL CLINICAL IMPRESSION(S) / ED DIAGNOSES   Final diagnoses:  Fall, initial encounter  Closed nondisplaced intertrochanteric fracture of right femur, initial encounter (HCC)     Rx / DC Orders   ED Discharge Orders     None        Note:  This document was prepared using Dragon voice recognition software and may include unintentional dictation errors.GLENWOOD Jacolyn Pae, MD 06/22/24 2312

## 2024-06-22 NOTE — ED Triage Notes (Signed)
 Pt BIB [facility] from home for fall from standing. Pt c/o rt hip and leg pain, 8/10. Pt had rt knee replacement 6W ago. No deformity or shortening noted. Hx HTN

## 2024-06-23 ENCOUNTER — Encounter: Payer: Self-pay | Admitting: Family Medicine

## 2024-06-23 ENCOUNTER — Inpatient Hospital Stay: Admitting: Anesthesiology

## 2024-06-23 ENCOUNTER — Inpatient Hospital Stay

## 2024-06-23 ENCOUNTER — Encounter
Admission: EM | Disposition: A | Discharge status: Home Health | Payer: Self-pay | Source: Home / Self Care | Attending: Internal Medicine

## 2024-06-23 ENCOUNTER — Other Ambulatory Visit: Payer: Self-pay

## 2024-06-23 DIAGNOSIS — E669 Obesity, unspecified: Secondary | ICD-10-CM

## 2024-06-23 DIAGNOSIS — E876 Hypokalemia: Secondary | ICD-10-CM | POA: Diagnosis not present

## 2024-06-23 DIAGNOSIS — F419 Anxiety disorder, unspecified: Secondary | ICD-10-CM

## 2024-06-23 DIAGNOSIS — D696 Thrombocytopenia, unspecified: Secondary | ICD-10-CM

## 2024-06-23 DIAGNOSIS — S72001S Fracture of unspecified part of neck of right femur, sequela: Secondary | ICD-10-CM

## 2024-06-23 DIAGNOSIS — I10 Essential (primary) hypertension: Secondary | ICD-10-CM

## 2024-06-23 HISTORY — PX: INTRAMEDULLARY (IM) NAIL INTERTROCHANTERIC: SHX5875

## 2024-06-23 LAB — BASIC METABOLIC PANEL WITH GFR
Anion gap: 9 (ref 5–15)
BUN: 18 mg/dL (ref 8–23)
CO2: 31 mmol/L (ref 22–32)
Calcium: 8.6 mg/dL — ABNORMAL LOW (ref 8.9–10.3)
Chloride: 102 mmol/L (ref 98–111)
Creatinine, Ser: 0.97 mg/dL (ref 0.61–1.24)
GFR, Estimated: >60 mL/min (ref >60)
Glucose, Bld: 91 mg/dL (ref 70–99)
Potassium: 3.4 mmol/L — ABNORMAL LOW (ref 3.5–5.1)
Sodium: 142 mmol/L (ref 135–145)

## 2024-06-23 LAB — URINALYSIS, ROUTINE W REFLEX MICROSCOPIC
Bilirubin Urine: NEGATIVE
Glucose, UA: NEGATIVE mg/dL
Hgb urine dipstick: NEGATIVE
Ketones, ur: NEGATIVE mg/dL
Leukocytes,Ua: NEGATIVE
Nitrite: NEGATIVE
Protein, ur: NEGATIVE mg/dL
Specific Gravity, Urine: 1.009 (ref 1.005–1.030)
pH: 7 (ref 5.0–8.0)

## 2024-06-23 LAB — CBC
HCT: 36.3 % — ABNORMAL LOW (ref 39.0–52.0)
Hemoglobin: 11.9 g/dL — ABNORMAL LOW (ref 13.0–17.0)
MCH: 30 pg (ref 26.0–34.0)
MCHC: 32.8 g/dL (ref 30.0–36.0)
MCV: 91.4 fL (ref 80.0–100.0)
Platelets: 113 K/uL — ABNORMAL LOW (ref 150–400)
RBC: 3.97 MIL/uL — ABNORMAL LOW (ref 4.22–5.81)
RDW: 13 % (ref 11.5–15.5)
WBC: 9.5 K/uL (ref 4.0–10.5)
nRBC: 0 % (ref 0.0–0.2)

## 2024-06-23 LAB — PROTIME-INR
INR: 1.1 (ref 0.8–1.2)
Prothrombin Time: 14.6 s (ref 11.4–15.2)

## 2024-06-23 LAB — TYPE AND SCREEN
ABO/RH(D): A NEG
Antibody Screen: NEGATIVE

## 2024-06-23 SURGERY — FIXATION, FRACTURE, INTERTROCHANTERIC, WITH INTRAMEDULLARY ROD
Anesthesia: General | Laterality: Right

## 2024-06-23 MED ORDER — FERROUS SULFATE 325 (65 FE) MG PO TABS
325.0000 mg | ORAL_TABLET | Freq: Every day | ORAL | Status: DC
  Administered 2024-06-24 – 2024-06-27 (×4): 325 mg via ORAL
  Filled 2024-06-23 (×4): qty 1

## 2024-06-23 MED ORDER — TRANEXAMIC ACID-NACL 1000-0.7 MG/100ML-% IV SOLN
INTRAVENOUS | Status: AC
  Filled 2024-06-23: qty 100

## 2024-06-23 MED ORDER — METOCLOPRAMIDE HCL 5 MG PO TABS: 5.0000 mg | ORAL_TABLET | Freq: Three times a day (TID) | ORAL | Status: DC | PRN

## 2024-06-23 MED ORDER — MORPHINE SULFATE (PF) 2 MG/ML IV SOLN: 0.5000 mg | INTRAVENOUS | Status: DC | PRN

## 2024-06-23 MED ORDER — DEXAMETHASONE SODIUM PHOSPHATE 10 MG/ML IJ SOLN
INTRAMUSCULAR | Status: AC
  Filled 2024-06-23: qty 1

## 2024-06-23 MED ORDER — OXYCODONE HCL 5 MG/5ML PO SOLN: 5.0000 mg | Freq: Once | ORAL | Status: AC | PRN

## 2024-06-23 MED ORDER — BISOPROLOL FUMARATE 5 MG PO TABS
5.0000 mg | ORAL_TABLET | Freq: Every day | ORAL | Status: DC
  Administered 2024-06-24 – 2024-06-27 (×4): 5 mg via ORAL
  Filled 2024-06-23 (×5): qty 1

## 2024-06-23 MED ORDER — KETOROLAC TROMETHAMINE 15 MG/ML IJ SOLN: 15.0000 mg | Freq: Four times a day (QID) | INTRAMUSCULAR | Status: DC | PRN

## 2024-06-23 MED ORDER — SENNA 8.6 MG PO TABS
1.0000 | ORAL_TABLET | Freq: Two times a day (BID) | ORAL | Status: DC
  Administered 2024-06-23 – 2024-06-24 (×4): 8.6 mg via ORAL
  Filled 2024-06-23 (×5): qty 1

## 2024-06-23 MED ORDER — TRANEXAMIC ACID-NACL 1000-0.7 MG/100ML-% IV SOLN
1000.0000 mg | Freq: Once | INTRAVENOUS | Status: AC
  Administered 2024-06-23: 1000 mg via INTRAVENOUS

## 2024-06-23 MED ORDER — PHENOL 1.4 % MT LIQD: 1.0000 | OROMUCOSAL | Status: DC | PRN

## 2024-06-23 MED ORDER — LIDOCAINE HCL (CARDIAC) PF 100 MG/5ML IV SOSY
PREFILLED_SYRINGE | INTRAVENOUS | Status: DC | PRN
  Administered 2024-06-23: 100 mg via INTRAVENOUS

## 2024-06-23 MED ORDER — FENTANYL CITRATE (PF) 100 MCG/2ML IJ SOLN
25.0000 ug | INTRAMUSCULAR | Status: DC | PRN
  Administered 2024-06-23: 25 ug via INTRAVENOUS

## 2024-06-23 MED ORDER — ACETAMINOPHEN 325 MG PO TABS
325.0000 mg | ORAL_TABLET | Freq: Four times a day (QID) | ORAL | Status: DC | PRN
  Administered 2024-06-24: 325 mg via ORAL
  Filled 2024-06-23: qty 1

## 2024-06-23 MED ORDER — ONDANSETRON HCL 4 MG PO TABS: 4.0000 mg | ORAL_TABLET | Freq: Four times a day (QID) | ORAL | Status: DC | PRN

## 2024-06-23 MED ORDER — SODIUM CHLORIDE 0.9 % IR SOLN
Status: DC | PRN
  Administered 2024-06-23: 250 mL

## 2024-06-23 MED ORDER — BUPIVACAINE HCL (PF) 0.5 % IJ SOLN
INTRAMUSCULAR | Status: AC
  Filled 2024-06-23: qty 10

## 2024-06-23 MED ORDER — FENTANYL CITRATE (PF) 100 MCG/2ML IJ SOLN
INTRAMUSCULAR | Status: AC
Start: 2024-06-23 — End: 2024-06-23
  Filled 2024-06-23: qty 2

## 2024-06-23 MED ORDER — OXYCODONE HCL 5 MG PO TABS
ORAL_TABLET | ORAL | Status: AC
Start: 2024-06-23 — End: 2024-06-23
  Filled 2024-06-23: qty 1

## 2024-06-23 MED ORDER — FLEET ENEMA RE ENEM: 1.0000 | ENEMA | Freq: Once | RECTAL | Status: DC | PRN

## 2024-06-23 MED ORDER — ENSURE PLUS HIGH PROTEIN PO LIQD
237.0000 mL | Freq: Three times a day (TID) | ORAL | Status: DC
  Administered 2024-06-26 – 2024-06-27 (×3): 237 mL via ORAL

## 2024-06-23 MED ORDER — ONDANSETRON HCL 4 MG/2ML IJ SOLN
INTRAMUSCULAR | Status: AC
  Filled 2024-06-23: qty 2

## 2024-06-23 MED ORDER — EPHEDRINE SULFATE-NACL 50-0.9 MG/10ML-% IV SOSY
PREFILLED_SYRINGE | INTRAVENOUS | Status: DC | PRN
  Administered 2024-06-23: 5 mg via INTRAVENOUS

## 2024-06-23 MED ORDER — PROPOFOL 10 MG/ML IV BOLUS
INTRAVENOUS | Status: DC | PRN
  Administered 2024-06-23: 50 ug/kg/min via INTRAVENOUS
  Administered 2024-06-23: 150 mg via INTRAVENOUS

## 2024-06-23 MED ORDER — ALUM & MAG HYDROXIDE-SIMETH 200-200-20 MG/5ML PO SUSP: 30.0000 mL | ORAL | Status: DC | PRN

## 2024-06-23 MED ORDER — ADULT MULTIVITAMIN W/MINERALS CH
1.0000 | ORAL_TABLET | Freq: Every day | ORAL | Status: DC
  Administered 2024-06-24 – 2024-06-27 (×4): 1 via ORAL
  Filled 2024-06-23 (×4): qty 1

## 2024-06-23 MED ORDER — GENTAMICIN SULFATE 40 MG/ML IJ SOLN
INTRAMUSCULAR | Status: AC
  Filled 2024-06-23: qty 2

## 2024-06-23 MED ORDER — HYDROMORPHONE HCL 1 MG/ML IJ SOLN: 0.5000 mg | INTRAMUSCULAR | Status: DC | PRN

## 2024-06-23 MED ORDER — FENTANYL CITRATE (PF) 100 MCG/2ML IJ SOLN
INTRAMUSCULAR | Status: DC | PRN
  Administered 2024-06-23 (×2): 50 ug via INTRAVENOUS

## 2024-06-23 MED ORDER — BUPIVACAINE-EPINEPHRINE (PF) 0.25% -1:200000 IJ SOLN
INTRAMUSCULAR | Status: AC
  Filled 2024-06-23: qty 60

## 2024-06-23 MED ORDER — HYDROCODONE-ACETAMINOPHEN 5-325 MG PO TABS
1.0000 | ORAL_TABLET | ORAL | Status: DC | PRN
  Administered 2024-06-24 – 2024-06-26 (×9): 1 via ORAL
  Filled 2024-06-23 (×9): qty 1

## 2024-06-23 MED ORDER — ENOXAPARIN SODIUM 30 MG/0.3ML IJ SOSY
30.0000 mg | PREFILLED_SYRINGE | INTRAMUSCULAR | Status: DC
  Administered 2024-06-24 – 2024-06-27 (×4): 30 mg via SUBCUTANEOUS
  Filled 2024-06-23 (×4): qty 0.3

## 2024-06-23 MED ORDER — CEFAZOLIN SODIUM-DEXTROSE 2-4 GM/100ML-% IV SOLN
INTRAVENOUS | Status: AC
  Filled 2024-06-23: qty 100

## 2024-06-23 MED ORDER — SUGAMMADEX SODIUM 200 MG/2ML IV SOLN
INTRAVENOUS | Status: DC | PRN
  Administered 2024-06-23: 200 mg via INTRAVENOUS

## 2024-06-23 MED ORDER — FENTANYL CITRATE (PF) 100 MCG/2ML IJ SOLN
INTRAMUSCULAR | Status: AC
  Filled 2024-06-23: qty 2

## 2024-06-23 MED ORDER — DIPHENHYDRAMINE HCL 25 MG PO CAPS
25.0000 mg | ORAL_CAPSULE | Freq: Four times a day (QID) | ORAL | Status: DC | PRN
  Administered 2024-06-23: 25 mg via ORAL
  Filled 2024-06-23: qty 1

## 2024-06-23 MED ORDER — ONDANSETRON HCL 4 MG/2ML IJ SOLN
4.0000 mg | Freq: Four times a day (QID) | INTRAMUSCULAR | Status: DC | PRN
  Administered 2024-06-24: 4 mg via INTRAVENOUS
  Filled 2024-06-23: qty 2

## 2024-06-23 MED ORDER — ROCURONIUM BROMIDE 100 MG/10ML IV SOLN
INTRAVENOUS | Status: DC | PRN
  Administered 2024-06-23: 50 mg via INTRAVENOUS

## 2024-06-23 MED ORDER — ROCURONIUM BROMIDE 10 MG/ML (PF) SYRINGE
PREFILLED_SYRINGE | INTRAVENOUS | Status: AC
  Filled 2024-06-23: qty 10

## 2024-06-23 MED ORDER — BISACODYL 10 MG RE SUPP: 10.0000 mg | Freq: Every day | RECTAL | Status: DC | PRN

## 2024-06-23 MED ORDER — FAMOTIDINE 20 MG PO TABS
20.0000 mg | ORAL_TABLET | Freq: Every day | ORAL | Status: DC
  Administered 2024-06-23 – 2024-06-27 (×5): 20 mg via ORAL
  Filled 2024-06-23 (×5): qty 1

## 2024-06-23 MED ORDER — DEXAMETHASONE SODIUM PHOSPHATE 10 MG/ML IJ SOLN
INTRAMUSCULAR | Status: DC | PRN
  Administered 2024-06-23: 10 mg via INTRAVENOUS

## 2024-06-23 MED ORDER — MENTHOL 3 MG MT LOZG: 1.0000 | LOZENGE | OROMUCOSAL | Status: DC | PRN

## 2024-06-23 MED ORDER — OXYCODONE HCL 5 MG PO TABS
5.0000 mg | ORAL_TABLET | Freq: Once | ORAL | Status: AC | PRN
  Administered 2024-06-23: 5 mg via ORAL

## 2024-06-23 MED ORDER — PROPOFOL 1000 MG/100ML IV EMUL
INTRAVENOUS | Status: AC
  Filled 2024-06-23: qty 100

## 2024-06-23 MED ORDER — HYDROCHLOROTHIAZIDE 12.5 MG PO TABS
6.2500 mg | ORAL_TABLET | Freq: Every day | ORAL | Status: DC
  Administered 2024-06-23 – 2024-06-27 (×5): 6.25 mg via ORAL
  Filled 2024-06-23 (×6): qty 1

## 2024-06-23 MED ORDER — BUPIVACAINE-EPINEPHRINE (PF) 0.25% -1:200000 IJ SOLN
INTRAMUSCULAR | Status: DC | PRN
  Administered 2024-06-23: 52 mL via PERINEURAL

## 2024-06-23 MED ORDER — CEFAZOLIN SODIUM-DEXTROSE 2-4 GM/100ML-% IV SOLN
2.0000 g | Freq: Three times a day (TID) | INTRAVENOUS | Status: AC
  Administered 2024-06-23 – 2024-06-24 (×3): 2 g via INTRAVENOUS
  Filled 2024-06-23 (×2): qty 100

## 2024-06-23 MED ORDER — LACTATED RINGERS IV SOLN: INTRAVENOUS | Status: DC | PRN

## 2024-06-23 MED ORDER — METOCLOPRAMIDE HCL 5 MG/ML IJ SOLN: 5.0000 mg | Freq: Three times a day (TID) | INTRAMUSCULAR | Status: DC | PRN

## 2024-06-23 MED ORDER — ONDANSETRON HCL 4 MG/2ML IJ SOLN
INTRAMUSCULAR | Status: DC | PRN
Start: 2024-06-23 — End: 2024-06-23
  Administered 2024-06-23: 4 mg via INTRAVENOUS

## 2024-06-23 SURGICAL SUPPLY — 35 items
BIT DRILL CALIBRATED 4.2 (BIT) IMPLANT
BIT DRILL CANN 16 HIP (BIT) IMPLANT
BIT DRILL CANN STP 6/9 HIP (BIT) IMPLANT
BIT DRILL TAPERED 10 (BIT) IMPLANT
BLADE TFNA HELICAL 125 (Anchor) IMPLANT
BNDG COHESIVE 4X5 TAN STRL LF (GAUZE/BANDAGES/DRESSINGS) ×1 IMPLANT
CHLORAPREP W/TINT 26 (MISCELLANEOUS) ×2 IMPLANT
DRAPE INCISE 23X17 STRL (DRAPES) ×1 IMPLANT
DRSG XEROFORM 1X8 (GAUZE/BANDAGES/DRESSINGS) IMPLANT
ELECTRODE REM PT RTRN 9FT ADLT (ELECTROSURGICAL) ×1 IMPLANT
GAUZE SPONGE 4X4 12PLY STRL (GAUZE/BANDAGES/DRESSINGS) ×2 IMPLANT
GLOVE INDICATOR 8.0 STRL GRN (GLOVE) ×1 IMPLANT
GLOVE SURG ORTHO 8.5 STRL (GLOVE) ×1 IMPLANT
GOWN STRL REUS W/ TWL LRG LVL3 (GOWN DISPOSABLE) ×1 IMPLANT
GOWN STRL REUS W/TWL LRG LVL4 (GOWN DISPOSABLE) ×1 IMPLANT
GUIDEWIRE 3.2X400 (WIRE) IMPLANT
IMPL DEG TI CANN 11MM/130 (Orthopedic Implant) IMPLANT
KIT TURNOVER KIT A (KITS) ×1 IMPLANT
MANIFOLD NEPTUNE II (INSTRUMENTS) ×1 IMPLANT
MAT ABSORB FLUID 56X50 GRAY (MISCELLANEOUS) ×1 IMPLANT
NDL SPNL 18GX3.5 QUINCKE PK (NEEDLE) ×1 IMPLANT
NEEDLE SPNL 18GX3.5 QUINCKE PK (NEEDLE) ×1 IMPLANT
NS IRRIG 500ML POUR BTL (IV SOLUTION) ×1 IMPLANT
PACK HIP COMPR (MISCELLANEOUS) ×1 IMPLANT
PAD ABD DERMACEA PRESS 5X9 (GAUZE/BANDAGES/DRESSINGS) ×2 IMPLANT
REAMER ROD DEEP FLUTE 2.5X950 (INSTRUMENTS) IMPLANT
SCREW CANN LOCK TI FT 5X42 (Screw) IMPLANT
SOLUTION PREP PVP 2OZ (MISCELLANEOUS) ×1 IMPLANT
STAPLER SKIN PROX 35W (STAPLE) ×1 IMPLANT
SUCTION TUBE FRAZIER 10FR DISP (SUCTIONS) ×1 IMPLANT
SUT VIC AB 0 CT1 36 (SUTURE) ×1 IMPLANT
SUT VIC AB 2-0 CT1 (SUTURE) ×1 IMPLANT
SYR 30ML LL (SYRINGE) ×1 IMPLANT
TRAP FLUID SMOKE EVACUATOR (MISCELLANEOUS) ×1 IMPLANT
WATER STERILE IRR 500ML POUR (IV SOLUTION) ×1 IMPLANT

## 2024-06-23 NOTE — Plan of Care (Signed)
  Problem: Education: Goal: Knowledge of General Education information will improve Description: Including pain rating scale, medication(s)/side effects and non-pharmacologic comfort measures Outcome: Progressing   Problem: Clinical Measurements: Goal: Ability to maintain clinical measurements within normal limits will improve Outcome: Progressing Goal: Will remain free from infection Outcome: Progressing Goal: Diagnostic test results will improve Outcome: Progressing Goal: Respiratory complications will improve Outcome: Progressing Goal: Cardiovascular complication will be avoided Outcome: Progressing   Problem: Activity: Goal: Risk for activity intolerance will decrease Outcome: Progressing   Problem: Coping: Goal: Level of anxiety will decrease Outcome: Progressing   Problem: Elimination: Goal: Will not experience complications related to bowel motility Outcome: Progressing Goal: Will not experience complications related to urinary retention Outcome: Progressing   Problem: Pain Managment: Goal: General experience of comfort will improve and/or be controlled Outcome: Progressing   Problem: Safety: Goal: Ability to remain free from injury will improve Outcome: Progressing   Problem: Skin Integrity: Goal: Risk for impaired skin integrity will decrease Outcome: Progressing

## 2024-06-23 NOTE — Anesthesia Postprocedure Evaluation (Signed)
 Anesthesia Post Note  Patient: Andrew MUSTO Sr.  Procedure(s) Performed: FIXATION, FRACTURE, INTERTROCHANTERIC, WITH INTRAMEDULLARY ROD (Right)  Patient location during evaluation: PACU Anesthesia Type: General Level of consciousness: awake and alert Pain management: pain level controlled Vital Signs Assessment: post-procedure vital signs reviewed and stable Respiratory status: spontaneous breathing, nonlabored ventilation, respiratory function stable and patient connected to nasal cannula oxygen Cardiovascular status: blood pressure returned to baseline and stable Postop Assessment: no apparent nausea or vomiting Anesthetic complications: no   No notable events documented.   Last Vitals:  Vitals:   06/23/24 1115 06/23/24 1201  BP: (!) 162/91 (!) 170/76  Pulse: (!) 55 (!) 53  Resp: 11 18  Temp:    SpO2: 92% 96%    Last Pain:  Vitals:   06/23/24 1248  TempSrc:   PainSc: 4                  Debby Mines

## 2024-06-23 NOTE — Progress Notes (Signed)
 Progress Note   Patient: Andrew Vasquez FMW:[id number] DOB: 23-Jun-1946 DOA: 06/22/2024     1 DOS: the patient was seen and examined on 06/23/2024   Brief hospital course: 78 y.o. Caucasian male with medical history significant for anxiety, depression, osteoarthritis, hypertension and GERD, who presented to the emergency room with acute onset of right hip pain after having mechanical fall at home.  The patient was on a chair and was trying to get up and with power being off he fell on his right side with subsequent right hip pain.  He had a right total knee arthroplasty about 6 weeks ago, with adequate healing of his right knee wound.  He denied any presyncope or syncope.  No chest pain or palpitations.  No cough or wheezing or dyspnea.  No nausea or vomiting or abdominal pain.  No head injuries.  No bleeding diathesis.   ED Course: When he came to the ER, BP was 179/66 with heart rate of 56 with otherwise normal vital signs.  Labs revealed a blood glucose of 140 hemoglobin of 12.5 and hematocrit of 39.1.  Platelets were 93.  PT/INR were normal.  Blood group was a negative with negative antibody screen.  UA was negative. EKG as reviewed by me :  EKG showed mild sinus bradycardia with a rate of 51 with incomplete right bundle branch block and T wave inversion laterally with mild ST segment depression without significant changes and an EKG on 520/25. Imaging: Portable chest x-ray showed no acute cardiopulmonary disease. Hip x-ray showed acute comminuted fracture of the proximal right femur in the intertrochanteric area.   The patient was given 0.5 mg of IV Dilaudid  and 25 mg of IV Benadryl .  He will be admitted to a surgical telemetry bed for further evaluation and management  7/19.  Dr. Cleotilde took to the operating room for right intratrochanteric fixation fracture with intramedullary rod.  Assessment and Plan: * Closed right hip fracture [facility]) Operating room on 7/19 by Dr. Cleotilde for right hip  intratrochanteric fixation with intramedullary rod.  Pain control.  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  Essential hypertension Continue Zebeta  and hydrochlorothiazide   Thrombocytopenia (HCC) Chronic in nature.  Check hepatitis C        Subjective: Patient was on a chair with wheels and he was bending over to do something and the chair slid out and he fell and broke his hip.  Normally does not walk with any devices.  Physical Exam: Vitals:   06/23/24 1030 06/23/24 1045 06/23/24 1100 06/23/24 1115  BP: (!) 174/78 (!) 167/83 (!) 172/84 (!) 162/91  Pulse: (!) 58 (!) 57 (!) 59 (!) 55  Resp: 12 14 18 11   Temp:   (!) 97 F (36.1 C)   TempSrc:      SpO2: 100% 96% 94% 92%  Weight:      Height:       Physical Exam HENT:     Head: Normocephalic.  Eyes:     General: Lids are normal.     Conjunctiva/sclera: Conjunctivae normal.  Cardiovascular:     Rate and Rhythm: Normal rate and regular rhythm.     Heart sounds: Normal heart sounds, S1 normal and S2 normal.  Pulmonary:     Breath sounds: No decreased breath sounds, wheezing, rhonchi or rales.  Abdominal:     Palpations: Abdomen is soft.     Tenderness: There is  no abdominal tenderness.  Musculoskeletal:     Right lower leg: No swelling.     Left lower leg: No swelling.  Skin:    General: Skin is warm.     Findings: No rash.  Neurological:     Mental Status: He is alert and oriented to person, place, and time.     Comments: Able to wiggle toes.     Data Reviewed: Sodium 142, potassium 3.4, creatinine 0.97, white blood cell count 9.5, hemoglobin 11.9, platelet count 113  Family Communication: Family in waiting room  Disposition: Status is: Inpatient Remains inpatient appropriate because: Operating room today for hip fracture.  Planned Discharge Destination: Home    Time spent: 28 minutes  Author: Charlie Patterson,  MD 06/23/2024 12:00 PM  For on call review [email or web address].

## 2024-06-23 NOTE — Assessment & Plan Note (Signed)
 Chronic in nature.  Hepatitis C pending

## 2024-06-23 NOTE — Assessment & Plan Note (Addendum)
 Continue Zebeta  and hydrochlorothiazide 

## 2024-06-23 NOTE — Progress Notes (Signed)
 Initial Nutrition Assessment  DOCUMENTATION CODES:   Obesity unspecified  INTERVENTION:   Ensure Plus High Protein po TID, each supplement provides 350 kcal and 20 grams of protein  MVI po daily   Pt at refeed risk; recommend monitor potassium, magnesium  and phosphorus labs daily until stable  Daily weights   NUTRITION DIAGNOSIS:   Increased nutrient needs related to hip fracture as evidenced by estimated needs.  GOAL:   Patient will meet greater than or equal to 90% of their needs  MONITOR:   PO intake, Supplement acceptance, Labs, Weight trends, I & O's, Skin  REASON FOR ASSESSMENT:   Consult Hip fracture protocol  ASSESSMENT:   78 y/o male with h/o HTN, anxiety, depression, chronic pain, GERD, kidney stones and recent right total knee arthroplasty who is admitted with R hip fracture after a fall now s/p repair 7/19.  RD working remotely.  Pt with increased estimated needs r/t hip fracture. Pt NPO today for scheduled surgery; pt has not been initiated on a regular diet. RD will add supplements and vitamins to support post op healing. Per chart, pt appears weight stable at baseline. RD will obtain exam and history at follow up.   Medications reviewed and include: hydrochlorothiazide , colace, lovenox , pepcid , ferrous sulfate , senokot  Labs reviewed: K 3.4(L)  NUTRITION - FOCUSED PHYSICAL EXAM: Unable to perform at this time   Diet Order:   Diet Order             Diet regular Room service appropriate? Yes; Fluid consistency: Thin  Diet effective now                  EDUCATION NEEDS:   Not appropriate for education at this time  Skin:  Skin Assessment: Reviewed RN Assessment (incision R hip)  Last BM:  7/18  Height:   Ht Readings from Last 1 Encounters:  06/22/24 6' (1.829 m)    Weight:   Wt Readings from Last 1 Encounters:  06/22/24 107.7 kg    Ideal Body Weight:  80.9 kg  BMI:  Body mass index is 32.21 kg/m.  Estimated Nutritional  Needs:   Kcal:  2200-2500kcal/day  Protein:  110-125g/day  Fluid:  2.1-2.4L/day  Augustin Shams MS, RD, LDN If unable to be reached, please send secure chat to RD inpatient available from 8:00a-4:00p daily

## 2024-06-23 NOTE — Assessment & Plan Note (Signed)
Will continue Klonopin

## 2024-06-23 NOTE — Anesthesia Preprocedure Evaluation (Signed)
 Anesthesia Evaluation  Patient identified by MRN, date of birth, ID band Patient awake    Reviewed: Allergy & Precautions, H&P , NPO status , Patient's Chart, lab work & pertinent test results  Airway Mallampati: III  TM Distance: >3 FB Neck ROM: full    Dental  (+) Chipped   Pulmonary neg shortness of breath, former smoker   Pulmonary exam normal        Cardiovascular Exercise Tolerance: Good hypertension, (-) angina (-) Orthopnea and (-) DOE Normal cardiovascular exam+ dysrhythmias (EKG SINUS BRADY)   TTE performed on 05/02/2024 revealed a normal left ventricular systolic function with an EF of >55%. There was mild apical LVH.  There were no regional wall motion abnormalities.  Left ventricular diastolic Doppler parameters were normal.  GLS -12.9%. Left atrium was mildly enlarged.  Right ventricular size and function normal with a TAPSE measuring 2.4 cm  (normal range >/= 1.6 cm).  RVSP = 33 mmHg.  There was trivial mitral and tricuspid valve regurgitation. All transvalvular gradients were noted to be normal providing no evidence suggestive of valvular stenosis. Aorta normal in size with no evidence of ectasia or aneurysmal dilatation.   Neuro/Psych  PSYCHIATRIC DISORDERS Anxiety     Chronic pain syndrome      GI/Hepatic Neg liver ROS,GERD  Controlled,,  Endo/Other  negative endocrine ROS    Renal/GU      Musculoskeletal   Abdominal   Peds  Hematology  (+) Blood dyscrasia thrombocytopenia   Anesthesia Other Findings Past Medical History: No date: Anxiety     Comment:  a.) on BZO (clonazepam ) PRN No date: Arthritis No date: Back pain No date: Depression 12/18/2020: History of dysplastic nevus     Comment:  left upper back, mild No date: History of kidney stones No date: Hypertension No date: Internal hemorrhoids 04/24/2024: Nose colonized with MRSA     Comment:  a.) presurgical PCR (+) 04/24/2024 prior to RIGHT  TKA No date: Pneumonia No date: Reflux esophagitis No date: Thrombocytopenia (HCC) No date: Tubular adenoma  Past Surgical History: No date: COLONOSCOPY No date: KNEE SURGERY 01/09/2015: LUMBAR LAMINECTOMY/DECOMPRESSION MICRODISCECTOMY; Left     Comment:  Procedure: LUMBAR FOUR-FIVE LAMINECTOMY/DECOMPRESSION               MICRODISCECTOMY 1 LEVEL;  Surgeon: Rockey Peru, MD;                Location: [facility];  Service: Neurosurgery;                Laterality: Left;  Left L45 microdiskectomy No date: VASECTOMY     Reproductive/Obstetrics negative OB ROS                              Anesthesia Physical Anesthesia Plan  ASA: 2  Anesthesia Plan: General ETT   Post-op Pain Management: Ofirmev  IV (intra-op)*, Toradol  IV (intra-op)* and Dilaudid  IV   Induction: Intravenous  PONV Risk Score and Plan: 2 and Dexamethasone  and Ondansetron   Airway Management Planned: Oral ETT  Additional Equipment:   Intra-op Plan:   Post-operative Plan: Extubation in OR  Informed Consent: I have reviewed the patients History and Physical, chart, labs and discussed the procedure including the risks, benefits and alternatives for the proposed anesthesia with the patient or authorized representative who has indicated his/her understanding and acceptance.     Dental Advisory Given  Plan Discussed with: Anesthesiologist, CRNA and Surgeon  Anesthesia Plan  Comments: (Patient consented for risks of anesthesia including but not limited to:  - adverse reactions to medications - damage to eyes, teeth, lips or other oral mucosa - nerve damage due to positioning  - sore throat or hoarseness - Damage to heart, brain, nerves, lungs, other parts of body or loss of life  Patient voiced understanding and assent.)        Anesthesia Quick Evaluation

## 2024-06-23 NOTE — Consult Note (Signed)
 ORTHOPAEDIC CONSULTATION  REQUESTING PHYSICIAN: [name], MD  Chief Complaint: Right hip pain  HPI: Andrew Vasquez. is a 78 y.o. male who complains of right hip pain after a fall from a chair last night at home.  The patient was at his computer and the rolling chair slid out from under him and he fell landing on his right hip.  He had immediate pain and inability to walk.  He was brought to the emergency room where exam and x-rays reveal a comminuted minimally displaced intertrochanteric fracture of the right hip.  Patient was admitted to the hospitalist service.  He has been cleared for surgery.  The patient had a right total knee replacement by Dr. Lorelle 6 weeks ago and has been doing well in his recovery from that.  He has not been on any blood thinners.  The risks and benefits of surgery versus nonoperative treatment were discussed with the patient and his son.  Postop protocols were discussed.  They did wish to proceed with surgery this morning.  Past Medical History:  Diagnosis Date   Anxiety    a.) on BZO (clonazepam ) PRN   Arthritis    Back pain    Depression    History of dysplastic nevus 12/18/2020   left upper back, mild   History of kidney stones    Hypertension    Internal hemorrhoids    Nose colonized with MRSA 04/24/2024   a.) presurgical PCR (+) 04/24/2024 prior to RIGHT TKA   Pneumonia    Reflux esophagitis    Thrombocytopenia (HCC)    Tubular adenoma    Past Surgical History:  Procedure Laterality Date   COLONOSCOPY     KNEE SURGERY     LUMBAR LAMINECTOMY/DECOMPRESSION MICRODISCECTOMY Left 01/09/2015   Procedure: LUMBAR FOUR-FIVE LAMINECTOMY/DECOMPRESSION MICRODISCECTOMY 1 LEVEL;  Surgeon: Rockey Peru, MD;  Location: [facility];  Service: Neurosurgery;  Laterality: Left;  Left L45 microdiskectomy   TOTAL KNEE ARTHROPLASTY Right 05/07/2024   Procedure: ARTHROPLASTY, KNEE, TOTAL;  Surgeon: Lorelle Hussar, MD;  Location: [facility];  Service:  Orthopedics;  Laterality: Right;   VASECTOMY     Social History   Socioeconomic History   Marital status: Divorced    Spouse name: Not on file   Number of children: Not on file   Years of education: Not on file   Highest education level: Not on file  Occupational History   Not on file  Tobacco Use   Smoking status: Former   Smokeless tobacco: Not on file  Substance and Sexual Activity   Alcohol use: No   Drug use: No    Frequency: 3.0 times per week   Sexual activity: Not on file  Other Topics Concern   Not on file  Social History Narrative   Not on file   Social Drivers of Health   Financial Resource Strain: Low Risk  (06/20/2024)   Received from [facility]   Overall Financial Resource Strain (CARDIA)    Difficulty of Paying Living Expenses: Not very hard  Recent Concern: Financial Resource Strain - Medium Risk (05/24/2024)   Received from [facility]   Overall Financial Resource Strain (CARDIA)    Difficulty of Paying Living Expenses: Somewhat hard  Food Insecurity: No Food Insecurity (06/23/2024)   Hunger Vital Sign    Worried About Running Out of Food in the Last Year: Never true    Ran Out of Food in the Last Year: Never true  Transportation  Needs: No Transportation Needs (06/23/2024)   PRAPARE - Administrator, Civil Service (Medical): No    Lack of Transportation (Non-Medical): No  Physical Activity: Not on file  Stress: Not on file  Social Connections: Moderately Isolated (06/23/2024)   Social Connection and Isolation Panel    Frequency of Communication with Friends and Family: More than three times a week    Frequency of Social Gatherings with Friends and Family: Twice a week    Attends Religious Services: More than 4 times per year    Active Member of [employer] or Organizations: No    Attends Banker Meetings: Never    Marital Status: Widowed   History reviewed. No pertinent family history. Allergies   Allergen Reactions   Sulfa Antibiotics Swelling    Other: confusion   Prior to Admission medications   Medication Sig Start Date End Date Taking? Authorizing Provider  acetaminophen  (TYLENOL ) 500 MG tablet Take 2 tablets (1,000 mg total) by mouth every 8 (eight) hours. 05/08/24  Yes Charlene Debby BROCKS, PA-C  bisoprolol -hydrochlorothiazide  (ZIAC ) 5-6.25 MG tablet Take 1 tablet by mouth daily. 09/24/21  Yes [provider]  clonazePAM  (KLONOPIN ) 1 MG tablet Take 1 mg by mouth at bedtime as needed (sleep). 04/11/24  Yes [provider]  ibuprofen (ADVIL) 200 MG tablet Take 600 mg by mouth every 6 (six) hours as needed for moderate pain (pain score 4-6).   Yes [provider]  solifenacin (VESICARE) 10 MG tablet Take 10 mg by mouth daily. 05/30/24  Yes [provider]  terazosin  (HYTRIN ) 5 MG capsule Take 5 mg by mouth every evening. 09/24/21 04/18/27 Yes [provider]  chlorhexidine  (HIBICLENS ) 4 % external liquid Apply 15 mLs (1 Application total) topically as directed for 30 doses. Use as directed daily for 5 days every other week for 6 weeks. Patient not taking: Reported on 06/23/2024 05/07/24   Aberman, Zachary, MD  docusate sodium  (COLACE) 100 MG capsule Take 1 capsule (100 mg total) by mouth 2 (two) times daily. Patient not taking: Reported on 06/23/2024 05/08/24   Charlene Debby BROCKS, PA-C  enoxaparin  (LOVENOX ) 30 MG/0.3ML injection Inject 0.3 mLs (30 mg total) into the skin daily for 14 days. 05/09/24 05/23/24  Charlene Debby BROCKS, PA-C  mupirocin  ointment (BACTROBAN ) 2 % Apply small amount to the inside of both nostrils TWICE a day for the next 5 days. Patient not taking: Reported on 06/23/2024 04/24/24   Elnor Dorise BRAVO, NP  ondansetron  (ZOFRAN ) 4 MG tablet Take 1 tablet (4 mg total) by mouth every 6 (six) hours as needed for nausea. Patient not taking: Reported on 06/23/2024 05/08/24   Charlene Debby BROCKS, PA-C  oxyCODONE  (ROXICODONE ) 5 MG immediate release tablet Take  0.5 tablets (2.5 mg total) by mouth every 8 (eight) hours as needed for breakthrough pain. Patient not taking: Reported on 06/23/2024 05/08/24 05/08/25  Charlene Debby BROCKS, PA-C  traMADol  (ULTRAM ) 50 MG tablet Take 1 tablet (50 mg total) by mouth every 6 (six) hours as needed for moderate pain (pain score 4-6). Patient not taking: Reported on 06/23/2024 05/08/24   Charlene Debby BROCKS DEVONNA   DG Chest 1 View Result Date: 06/22/2024 CLINICAL DATA:  Status post fall. EXAM: CHEST  1 VIEW COMPARISON:  August 02, 2010 FINDINGS: The heart size and mediastinal contours are within normal limits. No acute infiltrate, pleural effusion or pneumothorax is identified. Multilevel degenerative changes are seen throughout the thoracic spine. IMPRESSION: No active cardiopulmonary disease. Electronically Signed  By: Suzen Dials M.D.   On: 06/22/2024 22:01   DG HIP UNILAT WITH PELVIS 2-3 VIEWS RIGHT Result Date: 06/22/2024 CLINICAL DATA:  Status post fall. EXAM: DG HIP (WITH OR WITHOUT PELVIS) 2-3V RIGHT COMPARISON:  None Available. FINDINGS: An acute, comminuted fracture deformity is seen extending through the inter trochanteric region of the proximal right femur. A chronic deformity of the left inferior pubic ramus is suspected. There is no evidence of dislocation. Degenerative changes are seen in the form of joint space narrowing and acetabular sclerosis. IMPRESSION: Acute, comminuted fracture of the proximal right femur. Electronically Signed   By: Suzen Dials M.D.   On: 06/22/2024 21:59    Positive ROS: All other systems have been reviewed and were otherwise negative with the exception of those mentioned in the HPI and as above.  Physical Exam: General: Alert, no acute distress Cardiovascular: No pedal edema Respiratory: No cyanosis, no use of accessory musculature GI: No organomegaly, abdomen is soft and non-tender Skin: No lesions in the area of chief complaint Neurologic: Sensation intact  distally Psychiatric: Patient is competent for consent with normal mood and affect Lymphatic: No axillary or cervical lymphadenopathy  MUSCULOSKELETAL: Patient is alert and oriented lying in a hospital bed.  The right leg is shortened and externally rotated.  There is pain with any movement of the hip.  Neurovascular status is good.  There is right total knee incision is well-healed.  There is normal postop edema but no apparent injury to the knee replacement.  Left lower extremity and upper extremities are normal.  The spine is nontender.  Assessment: Comminuted minimally displaced intertrochanteric fracture right hip  Plan: Right hip nailing with a trochanteric fixation nail.    Kayla FORBES Pinal, MD [phone]   06/23/2024 8:05 AM

## 2024-06-23 NOTE — Assessment & Plan Note (Addendum)
 Operating room on 7/19 by Dr. Cleotilde for right hip intratrochanteric fixation with intramedullary rod.  Pain control.

## 2024-06-23 NOTE — Plan of Care (Signed)
   Problem: Education: Goal: Knowledge of General Education information will improve Description: Including pain rating scale, medication(s)/side effects and non-pharmacologic comfort measures Outcome: Progressing   Problem: Activity: Goal: Risk for activity intolerance will decrease Outcome: Progressing

## 2024-06-23 NOTE — Transfer of Care (Signed)
 Immediate Anesthesia Transfer of Care Note  Patient: Andrew Vasquez.  Procedure(s) Performed: FIXATION, FRACTURE, INTERTROCHANTERIC, WITH INTRAMEDULLARY ROD (Right)  Patient Location: PACU  Anesthesia Type:General  Level of Consciousness: drowsy  Airway & Oxygen Therapy: Patient Spontanous Breathing and Patient connected to face mask oxygen  Post-op Assessment: Report given to RN and Post -op Vital signs reviewed and stable  Post vital signs: Reviewed and stable  Last Vitals:  Vitals Value Taken Time  BP 152/78 06/23/24 10:21  Temp    Pulse 57 06/23/24 10:25  Resp 14 06/23/24 10:25  SpO2 99 % 06/23/24 10:25  Vitals shown include unfiled device data.  Last Pain:  Vitals:   06/23/24 0542  TempSrc:   PainSc: 8          Complications: No notable events documented.

## 2024-06-23 NOTE — Op Note (Signed)
 DATE OF SURGERY:  06/23/2024  TIME: 10:21 AM  PATIENT NAME:  Andrew JONETTA Blush Sr.  AGE: 78 y.o.  PRE-OPERATIVE DIAGNOSIS: Comminuted, mildly displaced intertrochanteric right hip fracture  POST-OPERATIVE DIAGNOSIS:  SAME  PROCEDURE: Right hip intratrochanteric FIXATION, FRACTURE, INTERTROCHANTERIC, WITH INTRAMEDULLARY ROD  SURGEON:  Kayla FORBES Pinal  ASST:  EBL: 80 cc  COMPLICATIONS: None  OPERATIVE IMPLANTS: Synthes trochanteric femoral nail 11 mm / 130 degree with interlocking helical blade 125 mm and distal locking screw  42 mm.  PREOPERATIVE INDICATIONS:  Andrew CRYSTAL Sr. is a 78 y.o. year old who fell and suffered a hip fracture. He was brought into the ER and then admitted and optimized and then elected for surgical intervention.    The risks benefits and alternatives were discussed with the patient including but not limited to the risks of nonoperative treatment, versus surgical intervention including infection, bleeding, nerve injury, malunion, nonunion, hardware prominence, hardware failure, need for hardware removal, blood clots, cardiopulmonary complications, morbidity, mortality, among others, and they were willing to proceed.    OPERATIVE PROCEDURE:  The patient was brought to the operating room and placed in the supine position.  General endotracheal anesthesia was administered, with a foley. He was placed on the fracture table.  Closed reduction was performed under C-arm guidance. The length of the femur was also measured using fluoroscopy. Time out was then performed after sterile prep and drape. He received preoperative antibiotics.  Incision was made proximal to the greater trochanter. A guidewire was placed in the appropriate position. Confirmation was made on AP and lateral views. The above-named nail was opened. I opened the proximal femur with a reamer. I then placed the nail by hand easily down. I did not need to ream the femur.  Once the nail was completely  seated, I placed a guidepin into the femoral head into the center center position through a second incision.  I measured the length, and then reamed the lateral cortex and up into the head. I then placed the helical blade. Slight compression was applied. Anatomic fixation achieved. Bone quality was mediocre.  I then secured the proximal interlock.  The distal locking screw was then placed and after confirming the position of the fracture fragments and hardware I then removed the instruments, and took final C-arm pictures AP and lateral the entire length of the leg. Anatomic reconstruction was achieved, and the wounds were irrigated copiously and closed with Vicryl  followed by staples and dry sterile dressing. Sponge and needle count were correct.   The patient was awakened and returned to PACU in stable and satisfactory condition. There no complications and the patient tolerated the procedure well.  He will be partial weightbearing as tolerated, and will be on Lovenox   For DVT prophylaxis.     Kayla FORBES Pinal, M.D.

## 2024-06-23 NOTE — H&P (Addendum)
 [place]   PATIENT NAME: Andrew Vasquez    MR#:  [id number]  DATE OF BIRTH:  05-Oct-1946  DATE OF ADMISSION:  06/22/2024  PRIMARY CARE PHYSICIAN: Auston Reyes BIRCH, MD   Patient is coming from: Home  REQUESTING/REFERRING PHYSICIAN: Siadecki, Sebastien, MD  CHIEF COMPLAINT:   Chief Complaint  Patient presents with   Fall    HISTORY OF PRESENT ILLNESS:  Sehaj Mcenroe. is a 78 y.o. Caucasian male with medical history significant for anxiety, depression, osteoarthritis, hypertension and GERD, who presented to the emergency room with acute onset of right hip pain after having mechanical fall at home.  The patient was on a chair and was trying to get up and with power being off he fell on his right side with subsequent right hip pain.  He had a right total knee arthroplasty about 6 weeks ago, with adequate healing of his right knee wound.  He denied any presyncope or syncope.  No chest pain or palpitations.  No cough or wheezing or dyspnea.  No nausea or vomiting or abdominal pain.  No head injuries.  No bleeding diathesis.  ED Course: When he came to the ER, BP was 179/66 with heart rate of 56 with otherwise normal vital signs.  Labs revealed a blood glucose of 140 hemoglobin of 12.5 and hematocrit of 39.1.  Platelets were 93.  PT/INR were normal.  Blood group was a negative with negative antibody screen.  UA was negative. EKG as reviewed by me :  EKG showed mild sinus bradycardia with a rate of 51 with incomplete right bundle branch block and T wave inversion laterally with mild ST segment depression without significant changes and an EKG on 520/25. Imaging: Portable chest x-ray showed no acute cardiopulmonary disease. Hip x-ray showed acute comminuted fracture of the proximal right femur in the intertrochanteric area.  The patient was given 0.5 mg of IV Dilaudid  and 25 mg of IV Benadryl .  He will be admitted to a surgical telemetry bed for further evaluation and  management.  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   a.) presurgical PCR (+) 04/24/2024 prior to RIGHT TKA   Pneumonia    Reflux esophagitis    Thrombocytopenia (HCC)    Tubular adenoma     PAST SURGICAL HISTORY:   Past Surgical History:  Procedure Laterality Date   COLONOSCOPY     KNEE SURGERY     LUMBAR LAMINECTOMY/DECOMPRESSION MICRODISCECTOMY Left 01/09/2015   Procedure: LUMBAR FOUR-FIVE LAMINECTOMY/DECOMPRESSION MICRODISCECTOMY 1 LEVEL;  Surgeon: Rockey Peru, MD;  Location: [facility];  Service: Neurosurgery;  Laterality: Left;  Left L45 microdiskectomy   TOTAL KNEE ARTHROPLASTY Right 05/07/2024   Procedure: ARTHROPLASTY, KNEE, TOTAL;  Surgeon: Lorelle Hussar, MD;  Location: [facility];  Service: Orthopedics;  Laterality: Right;   VASECTOMY      SOCIAL HISTORY:   Social History   Tobacco Use   Smoking status: Former   Smokeless tobacco: Not on file  Substance Use Topics   Alcohol use: No    FAMILY HISTORY:  Positive for MI in his father.  DRUG ALLERGIES:  Allergies  Allergen Reactions   Sulfa Antibiotics Swelling    Other: confusion    REVIEW OF SYSTEMS:   ROS As per history of present illness. All pertinent systems were reviewed above. Constitutional, HEENT, cardiovascular, respiratory, GI, GU, musculoskeletal, neuro, psychiatric, endocrine, integumentary and hematologic systems were reviewed and are otherwise negative/unremarkable except for positive findings mentioned above in the HPI.   MEDICATIONS AT HOME:   Prior to Admission medications   Medication Sig Start Date End Date Taking? Authorizing Provider  acetaminophen  (TYLENOL ) 500 MG tablet Take 2 tablets (1,000 mg  total) by mouth every 8 (eight) hours. 05/08/24  Yes Charlene Debby BROCKS, PA-C  bisoprolol -hydrochlorothiazide  (ZIAC ) 5-6.25 MG tablet Take 1 tablet by mouth daily. 09/24/21  Yes [provider]  clonazePAM  (KLONOPIN ) 1 MG tablet Take 1 mg by mouth at bedtime as needed (sleep). 04/11/24  Yes [provider]  ibuprofen (ADVIL) 200 MG tablet Take 600 mg by mouth every 6 (six) hours as needed for moderate pain (pain score 4-6).   Yes [provider]  solifenacin (VESICARE) 10 MG tablet Take 10 mg by mouth daily. 05/30/24  Yes [provider]  terazosin  (HYTRIN ) 5 MG capsule Take 5 mg by mouth every evening. 09/24/21 04/18/27 Yes [provider]  chlorhexidine  (HIBICLENS ) 4 % external liquid Apply 15 mLs (1 Application total) topically as directed for 30 doses. Use as directed daily for 5 days every other week for 6 weeks. Patient not taking: Reported on 06/23/2024 05/07/24   Aberman, Zachary, MD  docusate sodium  (COLACE) 100 MG capsule Take 1 capsule (100 mg total) by mouth 2 (two) times daily. Patient not taking: Reported on 06/23/2024 05/08/24   Charlene Debby BROCKS, PA-C  enoxaparin  (LOVENOX ) 30 MG/0.3ML injection Inject 0.3 mLs (30 mg total) into the skin daily for 14 days. 05/09/24 05/23/24  Charlene Debby BROCKS, PA-C  mupirocin  ointment (BACTROBAN ) 2 % Apply small amount to the inside of both nostrils TWICE a day for the next 5 days. Patient not taking: Reported on 06/23/2024 04/24/24   Gray, Bryan E, NP  ondansetron  (ZOFRAN ) 4 MG tablet Take 1 tablet (4 mg total) by mouth every 6 (six) hours as needed for nausea. Patient not taking: Reported on 06/23/2024 05/08/24   Charlene Debby BROCKS, PA-C  oxyCODONE  (ROXICODONE ) 5 MG immediate release tablet Take 0.5 tablets (2.5 mg total) by mouth every 8 (eight) hours as needed for breakthrough pain. Patient not taking: Reported on 06/23/2024 05/08/24 05/08/25  Charlene Debby BROCKS, PA-C  traMADol  (ULTRAM ) 50 MG tablet Take 1 tablet (50 mg total) by mouth  every 6 (six) hours as needed for moderate pain (pain score 4-6). Patient not taking: Reported on 06/23/2024 05/08/24   Charlene Debby BROCKS, PA-C      VITAL SIGNS:  Blood pressure (!) 183/77, pulse (!) 55, temperature 98.2 F (36.8 C), temperature source Oral, resp. rate 18, height 6' (1.829 m), weight 107.7 kg, SpO2 99%.  PHYSICAL EXAMINATION:  Physical Exam  GENERAL:  78 y.o.-year-old Caucasian male patient lying in the bed with no acute distress.  EYES: Pupils equal, round, reactive to light and accommodation. No scleral icterus. Extraocular muscles intact.  HEENT: Head atraumatic, normocephalic. Oropharynx and nasopharynx clear.  NECK:  Supple, no jugular venous distention. No thyroid enlargement, no tenderness.  LUNGS: Normal breath sounds bilaterally, no wheezing, rales,rhonchi or crepitation. No use of accessory muscles of respiration.  CARDIOVASCULAR: Regular rate and rhythm, S1, S2 normal. No murmurs, rubs, or gallops.  ABDOMEN: Soft, nondistended,  nontender. Bowel sounds present. No organomegaly or mass.  EXTREMITIES: No pedal edema, cyanosis, or clubbing.  NEUROLOGIC: Cranial nerves II through XII are intact. Muscle strength 5/5 in all extremities. Sensation intact. Gait not checked. Musculoskeletal: Right lateral hip tenderness. PSYCHIATRIC: The patient is alert and oriented x 3.  Normal affect and good eye contact. SKIN: No obvious rash, lesion, or ulcer.   LABORATORY PANEL:   CBC Recent Labs  Lab 06/22/24 2158  WBC 8.5  HGB 12.5*  HCT 39.1  PLT 93*   ------------------------------------------------------------------------------------------------------------------  Chemistries  Recent Labs  Lab 06/22/24 2158  NA 139  K 3.5  CL 101  CO2 26  GLUCOSE 140*  BUN 20  CREATININE 1.11  CALCIUM 9.0   ------------------------------------------------------------------------------------------------------------------  Cardiac Enzymes No results for input(s):  TROPONINI in the last 168 hours. ------------------------------------------------------------------------------------------------------------------  RADIOLOGY:  DG Chest 1 View Result Date: 06/22/2024 CLINICAL DATA:  Status post fall. EXAM: CHEST  1 VIEW COMPARISON:  August 02, 2010 FINDINGS: The heart size and mediastinal contours are within normal limits. No acute infiltrate, pleural effusion or pneumothorax is identified. Multilevel degenerative changes are seen throughout the thoracic spine. IMPRESSION: No active cardiopulmonary disease. Electronically Signed   By: Suzen Dials M.D.   On: 06/22/2024 22:01   DG HIP UNILAT WITH PELVIS 2-3 VIEWS RIGHT Result Date: 06/22/2024 CLINICAL DATA:  Status post fall. EXAM: DG HIP (WITH OR WITHOUT PELVIS) 2-3V RIGHT COMPARISON:  None Available. FINDINGS: An acute, comminuted fracture deformity is seen extending through the inter trochanteric region of the proximal right femur. A chronic deformity of the left inferior pubic ramus is suspected. There is no evidence of dislocation. Degenerative changes are seen in the form of joint space narrowing and acetabular sclerosis. IMPRESSION: Acute, comminuted fracture of the proximal right femur. Electronically Signed   By: Suzen Dials M.D.   On: 06/22/2024 21:59      IMPRESSION AND PLAN:  Assessment and Plan: * Closed right hip fracture [facility]) - The patient will be admitted to a surgical telemetry bed. - Pain management will be provided. - Orthopedic consult will be obtained. - Dr. Cleotilde was notified about the patient and is aware. - The patient will be kept n.p.o. after midnight. - The patient has no history of CHF, CVA, coronary artery disease, diabetes mellitus on insulin or renal failure with a creatinine of more than 2.  He is considered at average risk for age for perioperative cardiovascular events.  The revised cardiac risk index.  He has no current pulmonary issues.  Anxiety - Will  continue Klonopin .  Essential hypertension - Will continue antihypertensive therapy. - Elevated blood pressure was likely due to pain.   DVT prophylaxis: SCDs. Advanced Care Planning:  Code Status: full code. Family Communication:  The plan of care was discussed in details with the patient (and family). I answered all questions. The patient agreed to proceed with the above mentioned plan. Further management will depend upon hospital course. Disposition Plan: Back to previous home environment Consults called: Orthopedic consult All the records are reviewed and case discussed with ED provider.  Status is: Inpatient  At the time of the admission, it appears that the appropriate admission status for this patient is inpatient.  This is judged to be reasonable and necessary in order to provide the required intensity of service to ensure the patient's safety given the presenting symptoms, physical exam findings and initial radiographic and laboratory data in the context of comorbid conditions.  The patient requires  inpatient status due to high intensity of service, high risk of further deterioration and high frequency of surveillance required.  I certify that at the time of admission, it is my clinical judgment that the patient will require inpatient hospital care extending more than 2 midnights.                            Dispo: The patient is from: Home              Anticipated d/c is to: Home              Patient currently is not medically stable to d/c.              Difficult to place patient: No  Madison DELENA Peaches M.D on 06/23/2024 at 4:50 AM  [facility]   From 7 PM-7 AM, contact night-coverage [email or web address]  CC: Primary care physician; Auston Reyes BIRCH, MD

## 2024-06-23 NOTE — H&P (Signed)
THE PATIENT WAS SEEN PRIOR TO SURGERY TODAY.  HISTORY, ALLERGIES, HOME MEDICATIONS AND OPERATIVE PROCEDURE WERE REVIEWED. RISKS AND BENEFITS OF SURGERY DISCUSSED WITH PATIENT AGAIN.  NO CHANGES FROM INITIAL HISTORY AND PHYSICAL NOTED.    

## 2024-06-23 NOTE — Hospital Course (Signed)
 78 y.o. Caucasian male with medical history significant for anxiety, depression, osteoarthritis, hypertension and GERD, who presented to the emergency room with acute onset of right hip pain after having mechanical fall at home.  The patient was on a chair and was trying to get up and with power being off he fell on his right side with subsequent right hip pain.  He had a right total knee arthroplasty about 6 weeks ago, with adequate healing of his right knee wound.  He denied any presyncope or syncope.  No chest pain or palpitations.  No cough or wheezing or dyspnea.  No nausea or vomiting or abdominal pain.  No head injuries.  No bleeding diathesis.   ED Course: When he came to the ER, BP was 179/66 with heart rate of 56 with otherwise normal vital signs.  Labs revealed a blood glucose of 140 hemoglobin of 12.5 and hematocrit of 39.1.  Platelets were 93.  PT/INR were normal.  Blood group was a negative with negative antibody screen.  UA was negative. EKG as reviewed by me :  EKG showed mild sinus bradycardia with a rate of 51 with incomplete right bundle branch block and T wave inversion laterally with mild ST segment depression without significant changes and an EKG on 520/25. Imaging: Portable chest x-ray showed no acute cardiopulmonary disease. Hip x-ray showed acute comminuted fracture of the proximal right femur in the intertrochanteric area.   The patient was given 0.5 mg of IV Dilaudid  and 25 mg of IV Benadryl .  He will be admitted to a surgical telemetry bed for further evaluation and management  7/19.  Dr. Cleotilde took to the operating room for right intratrochanteric fixation fracture with intramedullary rod. 7/20.  Feels sore on postoperative day 1.

## 2024-06-23 NOTE — Anesthesia Procedure Notes (Signed)
 Procedure Name: Intubation Date/Time: 06/23/2024 8:19 AM  Performed by: Delores Evalene BROCKS, CRNAPre-anesthesia Checklist: Patient identified, Patient being monitored, Timeout performed, Emergency Drugs available and Suction available Patient Re-evaluated:Patient Re-evaluated prior to induction Oxygen Delivery Method: Circle system utilized Preoxygenation: Pre-oxygenation with 100% oxygen Induction Type: IV induction Ventilation: Mask ventilation without difficulty Laryngoscope Size: Mac, McGrath and 4 Grade View: Grade II Tube type: Oral Tube size: 7.5 mm Number of attempts: 1 Airway Equipment and Method: Stylet and Video-laryngoscopy Placement Confirmation: ETT inserted through vocal cords under direct vision, positive ETCO2 and breath sounds checked- equal and bilateral Secured at: 21 cm Tube secured with: Tape Dental Injury: Teeth and Oropharynx as per pre-operative assessment

## 2024-06-23 NOTE — Assessment & Plan Note (Signed)
 Replaced

## 2024-06-23 NOTE — Plan of Care (Signed)
   Problem: Education: Goal: Knowledge of General Education information will improve Description: Including pain rating scale, medication(s)/side effects and non-pharmacologic comfort measures Outcome: Progressing   Problem: Health Behavior/Discharge Planning: Goal: Ability to manage health-related needs will improve Outcome: Progressing   Problem: Clinical Measurements: Goal: Will remain free from infection Outcome: Progressing

## 2024-06-23 NOTE — Assessment & Plan Note (Signed)
 Class I with a BMI of 32.21

## 2024-06-24 DIAGNOSIS — I10 Essential (primary) hypertension: Secondary | ICD-10-CM | POA: Diagnosis not present

## 2024-06-24 DIAGNOSIS — S72001S Fracture of unspecified part of neck of right femur, sequela: Secondary | ICD-10-CM | POA: Diagnosis not present

## 2024-06-24 DIAGNOSIS — D696 Thrombocytopenia, unspecified: Secondary | ICD-10-CM | POA: Diagnosis not present

## 2024-06-24 DIAGNOSIS — F419 Anxiety disorder, unspecified: Secondary | ICD-10-CM | POA: Diagnosis not present

## 2024-06-24 LAB — CBC
HCT: 31.9 % — ABNORMAL LOW (ref 39.0–52.0)
Hemoglobin: 10.6 g/dL — ABNORMAL LOW (ref 13.0–17.0)
MCH: 29.9 pg (ref 26.0–34.0)
MCHC: 33.2 g/dL (ref 30.0–36.0)
MCV: 90.1 fL (ref 80.0–100.0)
Platelets: 127 K/uL — ABNORMAL LOW (ref 150–400)
RBC: 3.54 MIL/uL — ABNORMAL LOW (ref 4.22–5.81)
RDW: 12.7 % (ref 11.5–15.5)
WBC: 14.1 K/uL — ABNORMAL HIGH (ref 4.0–10.5)
nRBC: 0 % (ref 0.0–0.2)

## 2024-06-24 LAB — BASIC METABOLIC PANEL WITH GFR
Anion gap: 8 (ref 5–15)
BUN: 17 mg/dL (ref 8–23)
CO2: 29 mmol/L (ref 22–32)
Calcium: 8.3 mg/dL — ABNORMAL LOW (ref 8.9–10.3)
Chloride: 101 mmol/L (ref 98–111)
Creatinine, Ser: 0.96 mg/dL (ref 0.61–1.24)
GFR, Estimated: >60 mL/min (ref >60)
Glucose, Bld: 130 mg/dL — ABNORMAL HIGH (ref 70–99)
Potassium: 3.9 mmol/L (ref 3.5–5.1)
Sodium: 138 mmol/L (ref 135–145)

## 2024-06-24 LAB — PHOSPHORUS: Phosphorus: 3.7 mg/dL (ref 2.5–4.6)

## 2024-06-24 LAB — HEPATITIS C ANTIBODY: HCV Ab: NONREACTIVE

## 2024-06-24 LAB — MAGNESIUM: Magnesium: 2.1 mg/dL (ref 1.7–2.4)

## 2024-06-24 MED ORDER — POLYETHYLENE GLYCOL 3350 17 G PO PACK
17.0000 g | PACK | Freq: Every day | ORAL | Status: DC
  Administered 2024-06-24: 17 g via ORAL
  Filled 2024-06-24 (×2): qty 1

## 2024-06-24 NOTE — Progress Notes (Signed)
 Progress Note   Patient: Andrew Vasquez FMW:[id number] DOB: Feb 25, 1946 DOA: 06/22/2024     2 DOS: the patient was seen and examined on 06/24/2024   Brief hospital course: 78 y.o. Caucasian male with medical history significant for anxiety, depression, osteoarthritis, hypertension and GERD, who presented to the emergency room with acute onset of right hip pain after having mechanical fall at home.  The patient was on a chair and was trying to get up and with power being off he fell on his right side with subsequent right hip pain.  He had a right total knee arthroplasty about 6 weeks ago, with adequate healing of his right knee wound.  He denied any presyncope or syncope.  No chest pain or palpitations.  No cough or wheezing or dyspnea.  No nausea or vomiting or abdominal pain.  No head injuries.  No bleeding diathesis.   ED Course: When he came to the ER, BP was 179/66 with heart rate of 56 with otherwise normal vital signs.  Labs revealed a blood glucose of 140 hemoglobin of 12.5 and hematocrit of 39.1.  Platelets were 93.  PT/INR were normal.  Blood group was a negative with negative antibody screen.  UA was negative. EKG as reviewed by me :  EKG showed mild sinus bradycardia with a rate of 51 with incomplete right bundle branch block and T wave inversion laterally with mild ST segment depression without significant changes and an EKG on 520/25. Imaging: Portable chest x-ray showed no acute cardiopulmonary disease. Hip x-ray showed acute comminuted fracture of the proximal right femur in the intertrochanteric area.   The patient was given 0.5 mg of IV Dilaudid  and 25 mg of IV Benadryl .  He will be admitted to a surgical telemetry bed for further evaluation and management  7/19.  Dr. Cleotilde took to the operating room for right intratrochanteric fixation fracture with intramedullary rod. 7/20.  Feels sore on postoperative day 1.  Assessment and Plan: * Closed right hip fracture [facility]) Operating  room on 7/19 by Dr. Cleotilde for right hip intratrochanteric fixation with intramedullary rod.  Pain control.  PT initially recommending rehab  Essential hypertension Continue Zebeta  and hydrochlorothiazide   Anxiety Will continue Klonopin .  Thrombocytopenia (HCC) Chronic in nature.  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  Admitted with fall and hip fracture.  Physical Exam: Vitals:   06/23/24 2131 06/24/24 0438 06/24/24 0500 06/24/24 0830  BP: (!) 158/70 (!) 153/72  (!) 142/69  Pulse: 64 64  62  Resp: 16 16  16   Temp: 98.8 F (37.1 C) 98.3 F (36.8 C)  98.3 F (36.8 C)  TempSrc: Oral     SpO2: 98% 97%  96%  Weight:   101.9 kg   Height:       Physical Exam HENT:     Head: Normocephalic.  Eyes:     General: Lids are normal.     Conjunctiva/sclera: Conjunctivae normal.  Cardiovascular:     Rate and Rhythm: Normal rate and regular rhythm.     Heart sounds: Normal heart sounds, S1 normal and S2 normal.  Pulmonary:     Breath sounds: No decreased breath sounds, wheezing, rhonchi or rales.  Abdominal:     Palpations: Abdomen is soft.     Tenderness: There is no abdominal tenderness.  Musculoskeletal:     Right  lower leg: No swelling.     Left lower leg: No swelling.  Skin:    General: Skin is warm.     Findings: No rash.  Neurological:     Mental Status: He is alert and oriented to person, place, and time.     Comments: Unable to straight leg raise with right leg.     Data Reviewed: Creatinine 0.96, potassium 3.9, white blood cell count 14.1, hemoglobin 10.6, platelet count 127  Family Communication: Spoke with family at the bedside  Disposition: Status is: Inpatient Remains inpatient appropriate because: Postoperative day 1 for hip fracture  Planned Discharge Destination: Likely rehab    Time spent: 28 minutes  Author: Charlie Patterson,  MD 06/24/2024 2:54 PM  For on call review [email or web address].

## 2024-06-24 NOTE — Progress Notes (Signed)
 Physical Therapy Evaluation Patient Details Name: Andrew CAETANO Sr. MRN: [id number] DOB: August 15, 1946 Today's Date: 06/24/2024  History of Present Illness  Pt is a 78 y/o male admitted secondary to a mechnical fall out of a chair at home in which he sustained a R hip fx now s/p IM Rod (PWB'ing 50%). PMH including but not limited to HTN and recent hx of R TKA on 6/2.  Clinical Impression  Pt presented supine in bed with HOB elevated, awake and willing to participate in therapy session. Prior to admission, pt reported that he was independent with all functional mobility and ADLs. He stated that he was working with OPPT following his recent R TKA. Pt lives with children in a two level home (basement) with a ramped entrance. He will have a lot of family support at d/c but they are considering rehab first depending on his progress while admitted. At the time of evaluation, pt required mod A for bed mobility and transfers with use of RW. Pt only able to tolerate taking a few side steps at EOB secondary to significant pain despite being pre-medicated. PT currently recommending pt d/c to SNF for short-term rehab to maximize his safety and independence with functional mobility prior to returning home with family support.         If plan is discharge home, recommend the following: Two people to help with walking and/or transfers;A lot of help with bathing/dressing/bathroom;Help with stairs or ramp for entrance   Can travel by private vehicle   No    Equipment Recommendations None recommended by PT  Recommendations for Other Services       Functional Status Assessment Patient has had a recent decline in their functional status and demonstrates the ability to make significant improvements in function in a reasonable and predictable amount of time.     Precautions / Restrictions Precautions Precautions: Fall Recall of Precautions/Restrictions: Intact Restrictions Weight Bearing Restrictions Per  Provider Order: Yes RLE Weight Bearing Per Provider Order: Partial weight bearing RLE Partial Weight Bearing Percentage or Pounds: 50%      Mobility  Bed Mobility Overal bed mobility: Needs Assistance Bed Mobility: Supine to Sit     Supine to sit: Mod assist, HOB elevated     General bed mobility comments: increased time and effort, cueing for sequencing, assistance needed for R LE management off of bed and for trunk elevation to achieve an upright sitting position EOB    Transfers Overall transfer level: Needs assistance Equipment used: Rolling walker (2 wheels) Transfers: Sit to/from Stand Sit to Stand: Mod assist           General transfer comment: increased time and effort, cueing for safety hand placement and technique to maintain PWB'ing (50%) R LE throughout    Ambulation/Gait               General Gait Details: pt only able to tolerate taking a few side steps at EOB towards his L side with use of RW and min A, limited secondary to significant pain  Stairs            Wheelchair Mobility     Tilt Bed    Modified Rankin (Stroke Patients Only)       Balance Overall balance assessment: Needs assistance Sitting-balance support: Feet supported Sitting balance-Leahy Scale: Good     Standing balance support: During functional activity, Bilateral upper extremity supported, Reliant on assistive device for balance Standing balance-Leahy Scale: Poor  Pertinent Vitals/Pain Pain Assessment Pain Assessment: 0-10 Pain Score: 10-Worst pain ever Pain Location: L hip with movement Pain Descriptors / Indicators: Sore, Aching, Grimacing, Guarding Pain Intervention(s): Monitored during session, Premedicated before session, Repositioned    Home Living Family/patient expects to be discharged to:: Private residence Living Arrangements: Children Available Help at Discharge: Family;Available 24 hours/day Type of  Home: House Home Access: Ramped entrance       Home Layout: Laundry or work area in basement;Able to live on main level with bedroom/bathroom Home Equipment: Agricultural consultant (2 wheels);Toilet riser;Lift chair;Shower seat Additional Comments: pt s/p recent TKA and still has all equipment    Prior Function Prior Level of Function : Independent/Modified Independent             Mobility Comments: pt was still working with OPPT following R TKA ADLs Comments: independent     Extremity/Trunk Assessment   Upper Extremity Assessment Upper Extremity Assessment: Overall WFL for tasks assessed    Lower Extremity Assessment Lower Extremity Assessment: RLE deficits/detail RLE Deficits / Details: pt with decreased strength and ROM limitations secondary to post-op pain and weakness RLE: Unable to fully assess due to pain       Communication   Communication Communication: No apparent difficulties    Cognition Arousal: Alert Behavior During Therapy: WFL for tasks assessed/performed   PT - Cognitive impairments: No apparent impairments                         Following commands: Intact       Cueing Cueing Techniques: Verbal cues     General Comments      Exercises General Exercises - Lower Extremity Ankle Circles/Pumps: AROM, Both, 10 reps, Seated Long Arc Quad: AAROM, Right, 10 reps, Seated Hip ABduction/ADduction: AROM, Both, 5 reps, Seated Heel Raises: AROM, Both, 10 reps, Seated   Assessment/Plan    PT Assessment Patient needs continued PT services  PT Problem List Decreased strength;Decreased range of motion;Decreased mobility;Decreased activity tolerance;Decreased balance;Decreased coordination;Decreased knowledge of use of DME;Decreased safety awareness;Decreased knowledge of precautions;Pain       PT Treatment Interventions DME instruction;Gait training;Stair training;Functional mobility training;Therapeutic activities;Therapeutic exercise;Balance  training;Neuromuscular re-education;Patient/family education    PT Goals (Current goals can be found in the Care Plan section)  Acute Rehab PT Goals Patient Stated Goal: decrease pain PT Goal Formulation: With patient/family Time For Goal Achievement: 07/08/24 Potential to Achieve Goals: Good    Frequency 7X/week     Co-evaluation               AM-PAC PT 6 Clicks Mobility  Outcome Measure Help needed turning from your back to your side while in a flat bed without using bedrails?: A Little Help needed moving from lying on your back to sitting on the side of a flat bed without using bedrails?: A Lot Help needed moving to and from a bed to a chair (including a wheelchair)?: A Lot Help needed standing up from a chair using your arms (e.g., wheelchair or bedside chair)?: A Lot Help needed to walk in hospital room?: Total Help needed climbing 3-5 steps with a railing? : Total 6 Click Score: 11    End of Session Equipment Utilized During Treatment: Gait belt Activity Tolerance: Patient limited by pain Patient left: in bed;with call bell/phone within reach;with bed alarm set;with family/visitor present;Other (comment) (sitting EOB) Nurse Communication: Mobility status;Patient requests pain meds PT Visit Diagnosis: Other abnormalities of gait and mobility (R26.89);Pain Pain -  Right/Left: Right Pain - part of body: Hip    Time: 8892-8858 PT Time Calculation (min) (ACUTE ONLY): 34 min   Charges:   PT Evaluation $PT Eval Moderate Complexity: 1 Mod PT Treatments $Therapeutic Activity: 8-22 mins PT General Charges $$ ACUTE PT VISIT: 1 Visit         Andrew Vasquez, Andrew Vasquez  Acute Rehabilitation Services Office [phone]   Andrew Vasquez 06/24/2024, 1:03 PM

## 2024-06-24 NOTE — Progress Notes (Signed)
 Subjective: 1 Day Post-Op Procedure(s) (LRB): FIXATION, FRACTURE, INTERTROCHANTERIC, WITH INTRAMEDULLARY ROD (Right) Patient is alert and oriented and sitting up at the side of the bed with his significant other present.  He feels well overall.  Pain is mild to moderate. Hemoglobin is stable at 10.6. Dressing is dry.  Patient reports pain as mild.  Objective:   VITALS:   Vitals:   06/24/24 0438 06/24/24 0830  BP: (!) 153/72 (!) 142/69  Pulse: 64 62  Resp: 16 16  Temp: 98.3 F (36.8 C) 98.3 F (36.8 C)  SpO2: 97% 96%    Neurologically intact Dorsiflexion/Plantar flexion intact Incision: dressing C/D/I  LABS Recent Labs    06/22/24 2158 06/23/24 0510 06/24/24 0441  HGB 12.5* 11.9* 10.6*  HCT 39.1 36.3* 31.9*  WBC 8.5 9.5 14.1*  PLT 93* 113* 127*    Recent Labs    06/22/24 2158 06/23/24 0510 06/24/24 0441  NA 139 142 138  K 3.5 3.4* 3.9  BUN 20 18 17   CREATININE 1.11 0.97 0.96  GLUCOSE 140* 91 130*    Recent Labs    06/23/24 0103  INR 1.1     Assessment/Plan: 1 Day Post-Op Procedure(s) (LRB): FIXATION, FRACTURE, INTERTROCHANTERIC, WITH INTRAMEDULLARY ROD (Right)   Advance diet Up with therapy Discharge home with home health in a few days if possible. Discharge on 81 mg aspirin  twice daily for 6 weeks May be 50% weightbearing on the right leg with walker she return to my office 2 weeks after discharge

## 2024-06-25 ENCOUNTER — Encounter: Payer: Self-pay | Admitting: Specialist

## 2024-06-25 DIAGNOSIS — D696 Thrombocytopenia, unspecified: Secondary | ICD-10-CM | POA: Diagnosis not present

## 2024-06-25 DIAGNOSIS — S72001S Fracture of unspecified part of neck of right femur, sequela: Secondary | ICD-10-CM | POA: Diagnosis not present

## 2024-06-25 DIAGNOSIS — K5909 Other constipation: Secondary | ICD-10-CM

## 2024-06-25 DIAGNOSIS — I10 Essential (primary) hypertension: Secondary | ICD-10-CM | POA: Diagnosis not present

## 2024-06-25 DIAGNOSIS — F419 Anxiety disorder, unspecified: Secondary | ICD-10-CM | POA: Diagnosis not present

## 2024-06-25 DIAGNOSIS — K5900 Constipation, unspecified: Secondary | ICD-10-CM | POA: Insufficient documentation

## 2024-06-25 LAB — BASIC METABOLIC PANEL WITH GFR
Anion gap: 9 (ref 5–15)
BUN: 18 mg/dL (ref 8–23)
CO2: 33 mmol/L — ABNORMAL HIGH (ref 22–32)
Calcium: 8.6 mg/dL — ABNORMAL LOW (ref 8.9–10.3)
Chloride: 96 mmol/L — ABNORMAL LOW (ref 98–111)
Creatinine, Ser: 0.93 mg/dL (ref 0.61–1.24)
GFR, Estimated: >60 mL/min (ref >60)
Glucose, Bld: 93 mg/dL (ref 70–99)
Potassium: 3.6 mmol/L (ref 3.5–5.1)
Sodium: 138 mmol/L (ref 135–145)

## 2024-06-25 LAB — CBC
HCT: 32.4 % — ABNORMAL LOW (ref 39.0–52.0)
Hemoglobin: 10.3 g/dL — ABNORMAL LOW (ref 13.0–17.0)
MCH: 29.8 pg (ref 26.0–34.0)
MCHC: 31.8 g/dL (ref 30.0–36.0)
MCV: 93.6 fL (ref 80.0–100.0)
Platelets: 105 K/uL — ABNORMAL LOW (ref 150–400)
RBC: 3.46 MIL/uL — ABNORMAL LOW (ref 4.22–5.81)
RDW: 13.2 % (ref 11.5–15.5)
WBC: 10.4 K/uL (ref 4.0–10.5)
nRBC: 0 % (ref 0.0–0.2)

## 2024-06-25 MED ORDER — POLYETHYLENE GLYCOL 3350 17 G PO PACK: 17.0000 g | PACK | Freq: Two times a day (BID) | ORAL | Status: DC

## 2024-06-25 MED ORDER — MAGNESIUM CITRATE PO SOLN
1.0000 | Freq: Once | ORAL | Status: AC
  Administered 2024-06-25: 1 via ORAL
  Filled 2024-06-25: qty 296

## 2024-06-25 NOTE — Plan of Care (Signed)

## 2024-06-25 NOTE — Progress Notes (Signed)
 Progress Note   Patient: Andrew Vasquez FMW:[id number] DOB: 08/25/1946 DOA: 06/22/2024     3 DOS: the patient was seen and examined on 06/25/2024   Brief hospital course: 78 y.o. Caucasian male with medical history significant for anxiety, depression, osteoarthritis, hypertension and GERD, who presented to the emergency room with acute onset of right hip pain after having mechanical fall at home.  The patient was on a chair and was trying to get up and with power being off he fell on his right side with subsequent right hip pain.  He had a right total knee arthroplasty about 6 weeks ago, with adequate healing of his right knee wound.  He denied any presyncope or syncope.  No chest pain or palpitations.  No cough or wheezing or dyspnea.  No nausea or vomiting or abdominal pain.  No head injuries.  No bleeding diathesis.   ED Course: When he came to the ER, BP was 179/66 with heart rate of 56 with otherwise normal vital signs.  Labs revealed a blood glucose of 140 hemoglobin of 12.5 and hematocrit of 39.1.  Platelets were 93.  PT/INR were normal.  Blood group was a negative with negative antibody screen.  UA was negative. EKG as reviewed by me :  EKG showed mild sinus bradycardia with a rate of 51 with incomplete right bundle branch block and T wave inversion laterally with mild ST segment depression without significant changes and an EKG on 520/25. Imaging: Portable chest x-ray showed no acute cardiopulmonary disease. Hip x-ray showed acute comminuted fracture of the proximal right femur in the intertrochanteric area.   The patient was given 0.5 mg of IV Dilaudid  and 25 mg of IV Benadryl .  He will be admitted to a surgical telemetry bed for further evaluation and management  7/19.  Dr. Cleotilde took to the operating room for right intratrochanteric fixation fracture with intramedullary rod. 7/20.  Feels sore on postoperative day 1.  Assessment and Plan: * Closed right hip fracture [facility]) Operating  room on 7/19 by Dr. Cleotilde for right hip intratrochanteric fixation with intramedullary rod.  Pain control.  PT recommending rehab.  Essential hypertension Continue Zebeta  and hydrochlorothiazide   Anxiety Will continue Klonopin .  Thrombocytopenia (HCC) Chronic in nature.  Hepatitis C negative.  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  Increase MiraLAX  to twice daily.        Subjective: Patient still feels sore.  Admitted for hip fracture.  Otherwise offers no complaints.  No bowel movement yet since surgery  Physical Exam: Vitals:   06/24/24 2021 06/25/24 0415 06/25/24 0453 06/25/24 0735  BP: (!) 171/75 125/78  (!) 167/67  Pulse: (!) 54 (!) 53  (!) 58  Resp: 18 18  16   Temp: 98.1 F (36.7 C) 98 F (36.7 C)  98.2 F (36.8 C)  TempSrc: Oral   Oral  SpO2: 98% 97%  98%  Weight:   106.9 kg   Height:       Physical Exam HENT:     Head: Normocephalic.  Eyes:     General: Lids are normal.     Conjunctiva/sclera: Conjunctivae normal.  Cardiovascular:     Rate and Rhythm: Normal rate and regular rhythm.     Heart sounds: Normal heart sounds, S1 normal and S2 normal.  Pulmonary:     Breath sounds: No decreased breath sounds, wheezing, rhonchi or rales.  Abdominal:  Palpations: Abdomen is soft.     Tenderness: There is no abdominal tenderness.  Musculoskeletal:     Right lower leg: No swelling.     Left lower leg: No swelling.  Skin:    General: Skin is warm.     Findings: No rash.  Neurological:     Mental Status: He is alert and oriented to person, place, and time.     Comments: Unable to straight leg raise with right leg.     Data Reviewed: Creatinine 0.93, hemoglobin 10.3, platelet count 105, hepatitis C negative  Family Communication: Family at bedside  Disposition: Status is: Inpatient Remains inpatient appropriate because: TOC to look into rehab beds  Planned Discharge  Destination: Rehab    Time spent: 27 minutes  Author: Charlie Patterson, MD 06/25/2024 1:23 PM  For on call review [email or web address].

## 2024-06-25 NOTE — Progress Notes (Signed)
 Subjective: 2 Days Post-Op Procedure(s) (LRB): FIXATION, FRACTURE, INTERTROCHANTERIC, WITH INTRAMEDULLARY ROD (Right) Patient is awake alert and standing with a walker today.  He does not feel very well however and is constipated.  Is not having much pain. Hemoglobin is stable at 10.3. Dressing is dry.  Patient reports pain as moderate.  Objective:   VITALS:   Vitals:   06/25/24 0415 06/25/24 0735  BP: 125/78 (!) 167/67  Pulse: (!) 53 (!) 58  Resp: 18 16  Temp: 98 F (36.7 C) 98.2 F (36.8 C)  SpO2: 97% 98%    Neurologically intact Dorsiflexion/Plantar flexion intact Incision: dressing C/D/I  LABS Recent Labs    06/23/24 0510 06/24/24 0441 06/25/24 0517  HGB 11.9* 10.6* 10.3*  HCT 36.3* 31.9* 32.4*  WBC 9.5 14.1* 10.4  PLT 113* 127* 105*    Recent Labs    06/23/24 0510 06/24/24 0441 06/25/24 0517  NA 142 138 138  K 3.4* 3.9 3.6  BUN 18 17 18   CREATININE 0.97 0.96 0.93  GLUCOSE 91 130* 93    Recent Labs    06/23/24 0103  INR 1.1     Assessment/Plan: 2 Days Post-Op Procedure(s) (LRB): FIXATION, FRACTURE, INTERTROCHANTERIC, WITH INTRAMEDULLARY ROD (Right)   Advance diet Up with therapy Discharge home with home health if he progresses well enough.  Otherwise skilled nursing. Partial weightbearing right leg with walker 81 mg ASA twice daily on discharge for 6 weeks Return to clinic 2 weeks after discharge

## 2024-06-25 NOTE — Progress Notes (Signed)
 Physical Therapy Treatment Patient Details Name: Andrew LAWS Sr. MRN: [id number] DOB: 10-Aug-1946 Today's Date: 06/25/2024   History of Present Illness Pt is a 78 y/o male admitted secondary to a mechnical fall out of a chair at home in which he sustained a R hip fx now s/p IM Rod (PWB'ing 50%). PMH including but not limited to HTN and recent hx of R TKA on 6/2.    PT Comments  Pt was pleasant and motivated to participate during the session and put forth good effort throughout. Pt required +2 physical assistance for bed mobility tasks, as well as VC's for log rolling technique. Pt required physical assistance to complete STS from EOB with RW. Pt was unable to clear RLE when side stepping at EOB and was only able to shuffle R foot before moving LLE. Pt required VC's to push from RW with BUE when clearing LLE. Transfer to chair from EOB was attempted, but pt was unable to complete due to pain. Pt reported no adverse symptoms during the session other than R hip pain with SpO2 and HR WNL throughout on room air. Pt will benefit from continued PT services upon discharge to safely address deficits listed in patient problem list for decreased caregiver assistance and eventual return to PLOF.   If plan is discharge home, recommend the following: Two people to help with walking and/or transfers;A lot of help with bathing/dressing/bathroom;Help with stairs or ramp for entrance;Assistance with cooking/housework;Assist for transportation   Can travel by private vehicle     No  Equipment Recommendations  None recommended by PT    Recommendations for Other Services       Precautions / Restrictions Precautions Precautions: Fall Recall of Precautions/Restrictions: Intact Restrictions Weight Bearing Restrictions Per Provider Order: Yes RLE Weight Bearing Per Provider Order: Partial weight bearing RLE Partial Weight Bearing Percentage or Pounds: 50%     Mobility  Bed Mobility Overal bed mobility:  Needs Assistance Bed Mobility: Sit to Supine, Rolling Rolling: +2 for physical assistance, Mod assist     Sit to supine: +2 for physical assistance, Mod assist   General bed mobility comments: Pt required +2 modA for all bed mobility tasks for trunk control and BLE management. Pt required VC's for hand placement for log rolling technique.    Transfers Overall transfer level: Needs assistance Equipment used: Rolling walker (2 wheels) Transfers: Sit to/from Stand Sit to Stand: Mod assist           General transfer comment: Pt required modA for STS from EOB with RW. Pt required VC's for hand placement before initiating STS    Ambulation/Gait Ambulation/Gait assistance: Min assist, Mod assist Gait Distance (Feet): 2 Feet Assistive device: Rolling walker (2 wheels)         General Gait Details: Pt was only able to tolerate side steps at EOB towards R side with use of RW. Pt was unable to clear RLE from floor and was only able to shuffle it towards the R before moving LLE. Pt required VC's to push up from RW when moving LLE to maintain PWB precaution on RLE.   Stairs             Wheelchair Mobility     Tilt Bed    Modified Rankin (Stroke Patients Only)       Balance Overall balance assessment: Needs assistance Sitting-balance support: Feet supported, No upper extremity supported Sitting balance-Leahy Scale: Good     Standing balance support: During functional activity, Bilateral upper  extremity supported, Reliant on assistive device for balance Standing balance-Leahy Scale: Poor Standing balance comment: Pt heavily reliant on RW to maintain balance in stance                            Communication Communication Communication: No apparent difficulties  Cognition Arousal: Alert Behavior During Therapy: WFL for tasks assessed/performed   PT - Cognitive impairments: No apparent impairments                         Following commands:  Intact      Cueing Cueing Techniques: Verbal cues  Exercises Total Joint Exercises Long Arc Quad: AROM, Right, Left, Both, 10 reps    General Comments        Pertinent Vitals/Pain Pain Assessment Pain Assessment: 0-10 Pain Score: 5  Pain Location: R hip Pain Descriptors / Indicators: Sore, Aching, Grimacing, Guarding Pain Intervention(s): Monitored during session, Premedicated before session, Limited activity within patient's tolerance    Home Living                          Prior Function            PT Goals (current goals can now be found in the care plan section) Progress towards PT goals: PT to reassess next treatment    Frequency    7X/week      PT Plan      Co-evaluation              AM-PAC PT 6 Clicks Mobility   Outcome Measure  Help needed turning from your back to your side while in a flat bed without using bedrails?: A Lot Help needed moving from lying on your back to sitting on the side of a flat bed without using bedrails?: A Lot Help needed moving to and from a bed to a chair (including a wheelchair)?: A Lot Help needed standing up from a chair using your arms (e.g., wheelchair or bedside chair)?: A Lot Help needed to walk in hospital room?: Total Help needed climbing 3-5 steps with a railing? : Total 6 Click Score: 10    End of Session Equipment Utilized During Treatment: Gait belt Activity Tolerance: Patient limited by pain Patient left: in bed;with call bell/phone within reach;with bed alarm set;with family/visitor present Nurse Communication: Mobility status PT Visit Diagnosis: Other abnormalities of gait and mobility (R26.89);Pain Pain - Right/Left: Right Pain - part of body: Hip     Time: 8851-8789 PT Time Calculation (min) (ACUTE ONLY): 22 min  Charges:                            Leontine Ingles, SPT 06/25/24, 1:48 PM

## 2024-06-25 NOTE — Assessment & Plan Note (Signed)
 Resolved with mag citrate yesterday.  Since he has had numerous bowel movements, I will get rid of all of anticonstipation medications at this point

## 2024-06-26 DIAGNOSIS — S72001S Fracture of unspecified part of neck of right femur, sequela: Secondary | ICD-10-CM | POA: Diagnosis not present

## 2024-06-26 DIAGNOSIS — F419 Anxiety disorder, unspecified: Secondary | ICD-10-CM | POA: Diagnosis not present

## 2024-06-26 DIAGNOSIS — D696 Thrombocytopenia, unspecified: Secondary | ICD-10-CM | POA: Diagnosis not present

## 2024-06-26 DIAGNOSIS — I10 Essential (primary) hypertension: Secondary | ICD-10-CM | POA: Diagnosis not present

## 2024-06-26 LAB — CBC
HCT: 31.3 % — ABNORMAL LOW (ref 39.0–52.0)
Hemoglobin: 10.5 g/dL — ABNORMAL LOW (ref 13.0–17.0)
MCH: 30.3 pg (ref 26.0–34.0)
MCHC: 33.5 g/dL (ref 30.0–36.0)
MCV: 90.5 fL (ref 80.0–100.0)
Platelets: 129 K/uL — ABNORMAL LOW (ref 150–400)
RBC: 3.46 MIL/uL — ABNORMAL LOW (ref 4.22–5.81)
RDW: 13.3 % (ref 11.5–15.5)
WBC: 11.6 K/uL — ABNORMAL HIGH (ref 4.0–10.5)
nRBC: 0 % (ref 0.0–0.2)

## 2024-06-26 LAB — BASIC METABOLIC PANEL WITH GFR
Anion gap: 9 (ref 5–15)
BUN: 22 mg/dL (ref 8–23)
CO2: 33 mmol/L — ABNORMAL HIGH (ref 22–32)
Calcium: 8.5 mg/dL — ABNORMAL LOW (ref 8.9–10.3)
Chloride: 95 mmol/L — ABNORMAL LOW (ref 98–111)
Creatinine, Ser: 0.82 mg/dL (ref 0.61–1.24)
GFR, Estimated: >60 mL/min (ref >60)
Glucose, Bld: 112 mg/dL — ABNORMAL HIGH (ref 70–99)
Potassium: 3.5 mmol/L (ref 3.5–5.1)
Sodium: 137 mmol/L (ref 135–145)

## 2024-06-26 MED ORDER — POTASSIUM CHLORIDE CRYS ER 20 MEQ PO TBCR
40.0000 meq | EXTENDED_RELEASE_TABLET | Freq: Once | ORAL | Status: AC
  Administered 2024-06-26: 40 meq via ORAL
  Filled 2024-06-26: qty 2

## 2024-06-26 NOTE — Progress Notes (Signed)
 Subjective: 3 Days Post-Op Procedure(s) (LRB): FIXATION, FRACTURE, INTERTROCHANTERIC, WITH INTRAMEDULLARY ROD (Right) Patient is doing better today.  Has less pain and feels better overall.  The physical therapy has not been in yet. Hemoglobin is 10.9. Dressing is dry. He will require skilled nursing placement.  Patient reports pain as mild.  Objective:   VITALS:   Vitals:   06/26/24 0246 06/26/24 0748  BP: (!) 147/69 (!) 152/61  Pulse: (!) 56 60  Resp: 16 18  Temp: 98.2 F (36.8 C) 98.7 F (37.1 C)  SpO2: 96% 100%    Neurologically intact Dorsiflexion/Plantar flexion intact Incision: dressing C/D/I  LABS Recent Labs    06/24/24 0441 06/25/24 0517 06/26/24 0646  HGB 10.6* 10.3* 10.5*  HCT 31.9* 32.4* 31.3*  WBC 14.1* 10.4 11.6*  PLT 127* 105* 129*    Recent Labs    06/24/24 0441 06/25/24 0517 06/26/24 0646  NA 138 138 137  K 3.9 3.6 3.5  BUN 17 18 22   CREATININE 0.96 0.93 0.82  GLUCOSE 130* 93 112*    No results for input(s): LABPT, INR in the last 72 hours.   Assessment/Plan: 3 Days Post-Op Procedure(s) (LRB): FIXATION, FRACTURE, INTERTROCHANTERIC, WITH INTRAMEDULLARY ROD (Right)   Advance diet Up with therapy Discharge to SNF Discharge on 81 mg ASA twice daily for 6 weeks Partial weightbearing right leg with walker Return to my clinic in 2 weeks after discharge

## 2024-06-26 NOTE — Care Management Important Message (Signed)
 Important Message  Patient Details  Name: Andrew TOBER Sr. MRN: [id number] Date of Birth: 14-Oct-1946   Important Message Given:  Yes - Medicare IM     Damen Windsor W, CMA 06/26/2024, 10:31 AM

## 2024-06-26 NOTE — Progress Notes (Signed)
 Progress Note   Patient: Andrew Vasquez FMW:[id number] DOB: 31-May-1946 DOA: 06/22/2024     4 DOS: the patient was seen and examined on 06/26/2024   Brief hospital course: 78 y.o. Caucasian male with medical history significant for anxiety, depression, osteoarthritis, hypertension and GERD, who presented to the emergency room with acute onset of right hip pain after having mechanical fall at home.  The patient was on a chair and was trying to get up and with power being off he fell on his right side with subsequent right hip pain.  He had a right total knee arthroplasty about 6 weeks ago, with adequate healing of his right knee wound.  He denied any presyncope or syncope.  No chest pain or palpitations.  No cough or wheezing or dyspnea.  No nausea or vomiting or abdominal pain.  No head injuries.  No bleeding diathesis.   ED Course: When he came to the ER, BP was 179/66 with heart rate of 56 with otherwise normal vital signs.  Labs revealed a blood glucose of 140 hemoglobin of 12.5 and hematocrit of 39.1.  Platelets were 93.  PT/INR were normal.  Blood group was a negative with negative antibody screen.  UA was negative. EKG as reviewed by me :  EKG showed mild sinus bradycardia with a rate of 51 with incomplete right bundle branch block and T wave inversion laterally with mild ST segment depression without significant changes and an EKG on 520/25. Imaging: Portable chest x-ray showed no acute cardiopulmonary disease. Hip x-ray showed acute comminuted fracture of the proximal right femur in the intertrochanteric area.   The patient was given 0.5 mg of IV Dilaudid  and 25 mg of IV Benadryl .  He will be admitted to a surgical telemetry bed for further evaluation and management  7/19.  Dr. Cleotilde took to the operating room for right intratrochanteric fixation fracture with intramedullary rod. 7/20.  Feels sore on postoperative day 1.  Assessment and Plan: * Closed right hip fracture [facility]) Operating  room on 7/19 by Dr. Cleotilde for right hip intratrochanteric fixation with intramedullary rod.  Pain control.  PT recommending rehab.  Dr. Cleotilde recommends aspirin  81 mg twice a day for 6 weeks upon discharge.  Currently on Lovenox  to prevent DVT while here in the hospital.  Essential hypertension Continue Zebeta  and hydrochlorothiazide   Anxiety Will continue Klonopin .  Thrombocytopenia (HCC) Chronic in nature.  Hepatitis C negative.  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  Since he has had numerous bowel movements, I will get rid of all of anticonstipation medications at this point        Subjective: Patient with numerous bowel movements since last night.  Discontinue further meds for constipation.  Patient still feels very sore with his right leg unable to do too much.  Physical Exam: Vitals:   06/26/24 0246 06/26/24 0416 06/26/24 0748 06/26/24 1612  BP: (!) 147/69  (!) 152/61 (!) 112/56  Pulse: (!) 56  60 (!) 51  Resp: 16  18 17   Temp: 98.2 F (36.8 C)  98.7 F (37.1 C) 99.2 F (37.3 C)  TempSrc: Oral  Oral Oral  SpO2: 96%  100% 96%  Weight:  102.6 kg    Height:       Physical Exam HENT:     Head: Normocephalic.  Eyes:     General: Lids are normal.     Conjunctiva/sclera: Conjunctivae  normal.  Cardiovascular:     Rate and Rhythm: Normal rate and regular rhythm.     Heart sounds: Normal heart sounds, S1 normal and S2 normal.  Pulmonary:     Breath sounds: No decreased breath sounds, wheezing, rhonchi or rales.  Abdominal:     Palpations: Abdomen is soft.     Tenderness: There is no abdominal tenderness.  Musculoskeletal:     Right lower leg: No swelling.     Left lower leg: No swelling.  Skin:    General: Skin is warm.     Findings: No rash.  Neurological:     Mental Status: He is alert and oriented to person, place, and time.     Comments: Unable to lift right leg  while sitting on the bed.     Data Reviewed: Creatinine 0.82, sodium 137, potassium 3.5, chloride 95, CO2 33, white blood cell count 11.6, hemoglobin 10.5, platelet count 129  Family Communication: Family at bedside  Disposition: Status is: Inpatient Remains inpatient appropriate because: Waiting to hear back on rehab beds  Planned Discharge Destination: Rehab    Time spent: 28 minutes  Author: Charlie Patterson, MD 06/26/2024 4:17 PM  For on call review [email or web address].

## 2024-06-26 NOTE — Progress Notes (Signed)
 Physical Therapy Treatment Patient Details Name: Andrew CASASOLA Sr. MRN: [id number] DOB: 07-19-1946 Today's Date: 06/26/2024   History of Present Illness Pt is a 78 y/o male admitted secondary to a mechnical fall out of a chair at home in which he sustained a R hip fx now s/p IM Rod (PWB'ing 50%). PMH including but not limited to HTN and recent hx of R TKA on 6/2.    PT Comments  Pt was pleasant and motivated to participate during the session and put forth good effort throughout. Pt required physical assistance for bed mobility and STS with RW. Pt was able to perform 2 bouts of ambulation today and remained steady throughout. Throughout ambulation, pt required constant VC's to push off with BUE on RW when advancing LLE to maintain PWB on RLE. Pt reported no adverse symptoms during the session with SpO2 WNL throughout on room air. Pt will benefit from continued PT services upon discharge to safely address deficits listed in patient problem list for decreased caregiver assistance and eventual return to PLOF.     If plan is discharge home, recommend the following: A lot of help with bathing/dressing/bathroom;Help with stairs or ramp for entrance;Assistance with cooking/housework;Assist for transportation;A lot of help with walking and/or transfers   Can travel by private vehicle     No  Equipment Recommendations  None recommended by PT    Recommendations for Other Services       Precautions / Restrictions Precautions Precautions: Fall Recall of Precautions/Restrictions: Intact Restrictions Weight Bearing Restrictions Per Provider Order: Yes RLE Weight Bearing Per Provider Order: Partial weight bearing RLE Partial Weight Bearing Percentage or Pounds: 50%     Mobility  Bed Mobility Overal bed mobility: Needs Assistance Bed Mobility: Rolling, Supine to Sit Rolling: Min assist     Sit to supine: Min assist   General bed mobility comments: Pt required minA for all bed mobility tasks  for BLE management. Pt required VC's for hand placement for log rolling technique.    Transfers Overall transfer level: Needs assistance Equipment used: Rolling walker (2 wheels) Transfers: Sit to/from Stand Sit to Stand: Min assist, Mod assist           General transfer comment: Pt required minA-modA for STS from EOB and chair with RW to prevent posterior LOB, but remained steady throughout    Ambulation/Gait Ambulation/Gait assistance: Contact guard assist Gait Distance (Feet): 5 Feet x 1, 8 feet x 1 Assistive device: Rolling walker (2 wheels) Gait Pattern/deviations: Step-through pattern, Decreased step length - right, Decreased step length - left, Decreased stance time - right Gait velocity: decreased     General Gait Details: Pt demonstrated decreased step length and stance time on RLE during ambulation, but remained steady throughout and no LOBs occured. Pt required constant VC's to push with BUE on RW when advancing LLE to maintain PWB on RLE.   Stairs             Wheelchair Mobility     Tilt Bed    Modified Rankin (Stroke Patients Only)       Balance Overall balance assessment: Needs assistance Sitting-balance support: Feet supported, No upper extremity supported Sitting balance-Leahy Scale: Good     Standing balance support: During functional activity, Bilateral upper extremity supported, Reliant on assistive device for balance Standing balance-Leahy Scale: Fair Standing balance comment: Pt reliant on RW to maintain balance in stance and ambulation, but remained steady throughout and no LOBs occured  Communication Communication Communication: No apparent difficulties  Cognition Arousal: Alert Behavior During Therapy: WFL for tasks assessed/performed   PT - Cognitive impairments: No apparent impairments                         Following commands: Intact      Cueing Cueing Techniques: Verbal  cues, Gestural cues, Visual cues  Exercises      General Comments        Pertinent Vitals/Pain Pain Assessment Pain Assessment: 0-10 Pain Score: 7  Pain Location: R hip Pain Descriptors / Indicators: Sore, Aching Pain Intervention(s): Monitored during session, Premedicated before session, Limited activity within patient's tolerance    Home Living                          Prior Function            PT Goals (current goals can now be found in the care plan section) Progress towards PT goals: Progressing toward goals    Frequency    7X/week      PT Plan      Co-evaluation              AM-PAC PT 6 Clicks Mobility   Outcome Measure  Help needed turning from your back to your side while in a flat bed without using bedrails?: A Little Help needed moving from lying on your back to sitting on the side of a flat bed without using bedrails?: A Little Help needed moving to and from a bed to a chair (including a wheelchair)?: A Little Help needed standing up from a chair using your arms (e.g., wheelchair or bedside chair)?: A Little Help needed to walk in hospital room?: A Little Help needed climbing 3-5 steps with a railing? : Total 6 Click Score: 16    End of Session Equipment Utilized During Treatment: Gait belt Activity Tolerance: Patient tolerated treatment well Patient left: with call bell/phone within reach;in chair;with chair alarm set Nurse Communication: Mobility status PT Visit Diagnosis: Other abnormalities of gait and mobility (R26.89);Pain Pain - Right/Left: Right Pain - part of body: Hip     Time: 8479-8456 PT Time Calculation (min) (ACUTE ONLY): 23 min  Charges:                            Andrew Vasquez, SPT 06/26/24, 4:22 PM

## 2024-06-27 DIAGNOSIS — E669 Obesity, unspecified: Secondary | ICD-10-CM

## 2024-06-27 DIAGNOSIS — S72001A Fracture of unspecified part of neck of right femur, initial encounter for closed fracture: Secondary | ICD-10-CM | POA: Diagnosis not present

## 2024-06-27 DIAGNOSIS — E876 Hypokalemia: Secondary | ICD-10-CM

## 2024-06-27 DIAGNOSIS — D696 Thrombocytopenia, unspecified: Secondary | ICD-10-CM

## 2024-06-27 DIAGNOSIS — K5900 Constipation, unspecified: Secondary | ICD-10-CM

## 2024-06-27 DIAGNOSIS — F419 Anxiety disorder, unspecified: Secondary | ICD-10-CM | POA: Diagnosis not present

## 2024-06-27 DIAGNOSIS — W19XXXA Unspecified fall, initial encounter: Secondary | ICD-10-CM | POA: Diagnosis not present

## 2024-06-27 MED ORDER — ASPIRIN 81 MG PO TBEC: 81.0000 mg | DELAYED_RELEASE_TABLET | Freq: Two times a day (BID) | ORAL | 1 refills | Status: DC

## 2024-06-27 MED ORDER — HYDROCODONE-ACETAMINOPHEN 5-325 MG PO TABS: 1.0000 | ORAL_TABLET | ORAL | 0 refills | Status: DC | PRN

## 2024-06-27 MED ORDER — ENSURE PLUS HIGH PROTEIN PO LIQD: 237.0000 mL | Freq: Three times a day (TID) | ORAL | 2 refills | Status: DC

## 2024-06-27 MED ORDER — MAGNESIUM HYDROXIDE 400 MG/5ML PO SUSP: 30.0000 mL | Freq: Every day | ORAL | 0 refills | Status: AC | PRN

## 2024-06-27 MED ORDER — FERROUS SULFATE 325 (65 FE) MG PO TABS: 325.0000 mg | ORAL_TABLET | Freq: Every day | ORAL | 3 refills | Status: DC

## 2024-06-27 MED ORDER — ADULT MULTIVITAMIN W/MINERALS CH: 1.0000 | ORAL_TABLET | Freq: Every day | ORAL | 1 refills | Status: DC

## 2024-06-27 NOTE — Plan of Care (Signed)
  Problem: Education: Goal: Knowledge of General Education information will improve Description: Including pain rating scale, medication(s)/side effects and non-pharmacologic comfort measures Outcome: Adequate for Discharge   Problem: Health Behavior/Discharge Planning: Goal: Ability to manage health-related needs will improve Outcome: Adequate for Discharge   Problem: Clinical Measurements: Goal: Ability to maintain clinical measurements within normal limits will improve Outcome: Adequate for Discharge Goal: Will remain free from infection Outcome: Adequate for Discharge Goal: Diagnostic test results will improve Outcome: Adequate for Discharge Goal: Respiratory complications will improve Outcome: Adequate for Discharge Goal: Cardiovascular complication will be avoided Outcome: Adequate for Discharge   Problem: Activity: Goal: Risk for activity intolerance will decrease Outcome: Adequate for Discharge   Problem: Nutrition: Goal: Adequate nutrition will be maintained Outcome: Adequate for Discharge   Problem: Coping: Goal: Level of anxiety will decrease Outcome: Adequate for Discharge   Problem: Elimination: Goal: Will not experience complications related to bowel motility Outcome: Adequate for Discharge Goal: Will not experience complications related to urinary retention Outcome: Adequate for Discharge   Problem: Pain Managment: Goal: General experience of comfort will improve and/or be controlled Outcome: Adequate for Discharge   Problem: Safety: Goal: Ability to remain free from injury will improve Outcome: Adequate for Discharge   Problem: Skin Integrity: Goal: Risk for impaired skin integrity will decrease Outcome: Adequate for Discharge   Problem: Education: Goal: Verbalization of understanding the information provided (i.e., activity precautions, restrictions, etc) will improve Outcome: Adequate for Discharge Goal: Individualized Educational  Video(s) Outcome: Adequate for Discharge   Problem: Activity: Goal: Ability to ambulate and perform ADLs will improve Outcome: Adequate for Discharge   Problem: Clinical Measurements: Goal: Postoperative complications will be avoided or minimized Outcome: Adequate for Discharge   Problem: Self-Concept: Goal: Ability to maintain and perform role responsibilities to the fullest extent possible will improve Outcome: Adequate for Discharge   Problem: Pain Management: Goal: Pain level will decrease Outcome: Adequate for Discharge

## 2024-06-27 NOTE — TOC Transition Note (Signed)
 Transition of Care [facility]) - Discharge Note   Patient Details  Name: Andrew MIZELL Sr. MRN: [id number] Date of Birth: Oct 29, 1946  Transition of Care [facility]) CM/SW Contact:  Elouise LULLA Capri, RN 06/27/2024, 4:08 PM   Clinical Narrative:     Discharge orders noted. Home health RN/PT/OT orders noted. Patient to discharge home with patient's son, [name]. CM secure message to Darleene [facility] regarding pending discharge.   Final next level of care: Home w Home Health Services Barriers to Discharge: No Barriers Identified   Patient Goals and CMS Choice    Home health  Discharge Placement     Home health  RN/PT/OT   Discharge Plan and Services Additional resources added to the After Visit Summary for        Date [facility] Agency Contacted: 06/27/24 Time HH Agency Contacted: 1541 Representative spoke with at [facility] Agency: Darleene  Social Drivers of Health (SDOH) Interventions SDOH Screenings   Food Insecurity: No Food Insecurity (06/23/2024)  Housing: Low Risk  (06/23/2024)  Transportation Needs: No Transportation Needs (06/23/2024)  Utilities: Not At Risk (06/23/2024)  Financial Resource Strain: Low Risk  (06/20/2024)   Received from [facility]  Recent Concern: Financial Resource Strain - Medium Risk (05/24/2024)   Received from [facility]  Social Connections: Moderately Isolated (06/23/2024)  Tobacco Use: Medium Risk (06/23/2024)     Readmission Risk Interventions     No data to display
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
## 2024-06-27 NOTE — Progress Notes (Signed)
 Physical Therapy Treatment Patient Details Name: Andrew Vasquez Sr. MRN: [id number] DOB: December 23, 1945 Today's Date: 06/27/2024   History of Present Illness Pt is a 78 y/o male admitted secondary to a mechnical fall out of a chair at home in which he sustained a R hip fx now s/p IM Rod (PWB'ing 50%). PMH including but not limited to HTN and recent hx of R TKA on 6/2.    PT Comments  Pt was pleasant and motivated to participate during the session and put forth good effort throughout. Ambulation was limited during today's session, due to pt reporting R hip pain. Pt required VC's to maintain PWB on RLE during ambulation, but remained steady throughout. Pt tolerated seated and supine therex described below well. Pt reported no adverse symptoms during the session other than R hip pain with SpO2 and HR WNL throughout on room air. Pt will benefit from continued PT services upon discharge to safely address deficits listed in patient problem list for decreased caregiver assistance and eventual return to PLOF.   If plan is discharge home, recommend the following: A lot of help with bathing/dressing/bathroom;Help with stairs or ramp for entrance;Assistance with cooking/housework;Assist for transportation;A lot of help with walking and/or transfers   Can travel by private vehicle     No  Equipment Recommendations  None recommended by PT    Recommendations for Other Services       Precautions / Restrictions Precautions Precautions: Fall Recall of Precautions/Restrictions: Intact Restrictions Weight Bearing Restrictions Per Provider Order: Yes RLE Weight Bearing Per Provider Order: Partial weight bearing RLE Partial Weight Bearing Percentage or Pounds: 50%     Mobility  Bed Mobility Overal bed mobility: Needs Assistance Bed Mobility: Sit to Supine       Sit to supine: Min assist   General bed mobility comments: Pt required minA for all bed mobility tasks for BLE management and trunk control.     Transfers Overall transfer level: Needs assistance Equipment used: Rolling walker (2 wheels) Transfers: Sit to/from Stand Sit to Stand: Min assist, Mod assist           General transfer comment: Pt required minA-modA for STS from chair with RW to prevent posterior LOB, but remained steady throughout    Ambulation/Gait Ambulation/Gait assistance: Contact guard assist Gait Distance (Feet): 4 Feet Assistive device: Rolling walker (2 wheels) Gait Pattern/deviations: Step-to pattern, Decreased step length - right, Decreased step length - left, Decreased stance time - right Gait velocity: decreased     General Gait Details: Pt demonstrated decreased step length and stance time on RLE during ambulation, but remained steady throughout and no LOBs occured. Pt required constant VC's to push with BUE on RW when advancing LLE to maintain PWB on RLE, as well as to maintain RW within BOS.   Stairs             Wheelchair Mobility     Tilt Bed    Modified Rankin (Stroke Patients Only)       Balance Overall balance assessment: Needs assistance Sitting-balance support: Feet supported, No upper extremity supported Sitting balance-Leahy Scale: Good     Standing balance support: During functional activity, Bilateral upper extremity supported, Reliant on assistive device for balance Standing balance-Leahy Scale: Fair Standing balance comment: Pt reliant on RW to maintain balance in stance and ambulation, but remained steady throughout and no LOBs occured  Communication Communication Communication: No apparent difficulties  Cognition Arousal: Alert Behavior During Therapy: WFL for tasks assessed/performed   PT - Cognitive impairments: No apparent impairments                         Following commands: Intact      Cueing Cueing Techniques: Verbal cues, Gestural cues, Visual cues  Exercises Total Joint Exercises Ankle  Circles/Pumps: AROM, Both, 10 reps Quad Sets: AROM, Both, 10 reps Gluteal Sets: AROM, Both, 10 reps Long Arc Quad: AROM, Both, 10 reps    General Comments        Pertinent Vitals/Pain Pain Assessment Pain Assessment: 0-10 Pain Score: 7  Pain Location: R hip Pain Descriptors / Indicators: Sore, Aching Pain Intervention(s): Monitored during session, Limited activity within patient's tolerance    Home Living                          Prior Function            PT Goals (current goals can now be found in the care plan section) Progress towards PT goals: PT to reassess next treatment    Frequency    7X/week      PT Plan      Co-evaluation              AM-PAC PT 6 Clicks Mobility   Outcome Measure  Help needed turning from your back to your side while in a flat bed without using bedrails?: A Little Help needed moving from lying on your back to sitting on the side of a flat bed without using bedrails?: A Little Help needed moving to and from a bed to a chair (including a wheelchair)?: A Little Help needed standing up from a chair using your arms (e.g., wheelchair or bedside chair)?: A Little Help needed to walk in hospital room?: A Little Help needed climbing 3-5 steps with a railing? : Total 6 Click Score: 16    End of Session Equipment Utilized During Treatment: Gait belt Activity Tolerance: Patient limited by pain Patient left: with call bell/phone within reach;in bed;with bed alarm set;with family/visitor present Nurse Communication: Mobility status PT Visit Diagnosis: Other abnormalities of gait and mobility (R26.89);Pain Pain - Right/Left: Right Pain - part of body: Hip     Time: 8984-8960 PT Time Calculation (min) (ACUTE ONLY): 24 min  Charges:                            Leontine Ingles, SPT 06/27/24, 12:54 PM

## 2024-06-27 NOTE — Discharge Summary (Signed)
 Physician Discharge Summary   Patient: Andrew AUBUCHON Sr. MRN: [id number] DOB: [birth year]  Admit date:     06/22/2024  Discharge date: 06/27/24  Discharge Physician: Amaryllis Dare   PCP: Auston Reyes JONETTA, MD   Recommendations at discharge:  Please obtain CBC and BMP and follow-up Follow-up with primary care provider Follow-up with orthopedic surgery  Discharge Diagnoses: Principal Problem:   Closed right hip fracture [facility]) Active Problems:   Essential hypertension   Anxiety   Thrombocytopenia (HCC)   Obesity (BMI 30-39.9)   Hypokalemia   Constipation   [facility] Course: 78 y.o. Caucasian male with medical history significant for anxiety, depression, osteoarthritis, hypertension and GERD, who presented to the emergency room with acute onset of right hip pain after having mechanical fall at home.  The patient was on a chair and was trying to get up and with power being off he fell on his right side with subsequent right hip pain.  He had a right total knee arthroplasty about 6 weeks ago, with adequate healing of his right knee wound.  He denied any presyncope or syncope.  No chest pain or palpitations.  No cough or wheezing or dyspnea.  No nausea or vomiting or abdominal pain.  No head injuries.  No bleeding diathesis.   ED Course: When he came to the ER, BP was 179/66 with heart rate of 56 with otherwise normal vital signs.  Labs revealed a blood glucose of 140 hemoglobin of 12.5 and hematocrit of 39.1.  Platelets were 93.  PT/INR were normal.  Blood group was a negative with negative antibody screen.  UA was negative. EKG as reviewed by me :  EKG showed mild sinus bradycardia with a rate of 51 with incomplete right bundle branch block and T wave inversion laterally with mild ST segment depression without significant changes and an EKG on 520/25. Imaging: Portable chest x-ray showed no acute cardiopulmonary disease. Hip x-ray showed acute comminuted fracture of the proximal right  femur in the intertrochanteric area.   The patient was given 0.5 mg of IV Dilaudid  and 25 mg of IV Benadryl .  He will be admitted to a surgical telemetry bed for further evaluation and management  7/19.  Dr. Cleotilde took to the operating room for right intratrochanteric fixation fracture with intramedullary rod.  7/23: Patient remained hemodynamically stable after ORIF.  Started on iron supplement.  Orthopedic surgery was recommending aspirin  81 mg twice daily for 6 weeks for DVT prophylaxis, patient received Lovenox  while in the hospital.  Our physical therapist initially recommended rehab but patient later on decline and wants to go home with home health which was ordered.  Case was discussed with family and they will provide 24/7 support.  Patient will continue with his home medications and need to have a close follow-up with his providers for further management.  Assessment and Plan: * Closed right hip fracture [facility]) Operating room on 7/19 by Dr. Cleotilde for right hip intratrochanteric fixation with intramedullary rod.  Pain control.  PT recommending rehab.  Dr. Cleotilde recommends aspirin  81 mg twice a day for 6 weeks upon discharge.  Currently on Lovenox  to prevent DVT while here in the hospital.  Essential hypertension Continue Zebeta  and hydrochlorothiazide   Anxiety Will continue Klonopin .  Thrombocytopenia (HCC) Chronic in nature.  Hepatitis C negative.  Obesity (BMI 30-39.9) Class I with a BMI of 30.68  Hypokalemia Replaced  Constipation Resolved with mag citrate. Patient can use magnesium  citrate or over-the-counter stool softener  for concern of constipation, will be high risk due to taking pain medication.      Pain control - [place]  Controlled Substance Reporting System database was reviewed. and patient was instructed, not to drive, operate heavy machinery, perform activities at heights, swimming or participation in water  activities or provide baby-sitting  services while on Pain, Sleep and Anxiety Medications; until their outpatient Physician has advised to do so again. Also recommended to not to take more than prescribed Pain, Sleep and Anxiety Medications.  Consultants: Orthopedic surgery Procedures performed: ORIF Disposition: Home health Diet recommendation:  Discharge Diet Orders (From admission, onward)     Start     Ordered   06/27/24 0000  Diet - low sodium heart healthy        06/27/24 1556           Regular diet DISCHARGE MEDICATION: Allergies as of 06/27/2024       Reactions   Sulfa Antibiotics Swelling   Other: confusion        Medication List     STOP taking these medications    chlorhexidine  4 % external liquid Commonly known as: HIBICLENS    enoxaparin  30 MG/0.3ML injection Commonly known as: LOVENOX    ibuprofen 200 MG tablet Commonly known as: ADVIL   mupirocin  ointment 2 % Commonly known as: BACTROBAN    ondansetron  4 MG tablet Commonly known as: ZOFRAN    oxyCODONE  5 MG immediate release tablet Commonly known as: Roxicodone    traMADol  50 MG tablet Commonly known as: ULTRAM        TAKE these medications    acetaminophen  500 MG tablet Commonly known as: TYLENOL  Take 2 tablets (1,000 mg total) by mouth every 8 (eight) hours.   aspirin  EC 81 MG tablet Take 1 tablet (81 mg total) by mouth 2 (two) times daily. Swallow whole.   bisoprolol -hydrochlorothiazide  5-6.25 MG tablet Commonly known as: ZIAC  Take 1 tablet by mouth daily.   clonazePAM  1 MG tablet Commonly known as: KLONOPIN  Take 1 mg by mouth at bedtime as needed (sleep).   docusate sodium  100 MG capsule Commonly known as: COLACE Take 1 capsule (100 mg total) by mouth 2 (two) times daily.   feeding supplement Liqd Take 237 mLs by mouth 3 (three) times daily between meals.   ferrous sulfate  325 (65 FE) MG tablet Take 1 tablet (325 mg total) by mouth daily with breakfast. Start taking on: June 28, 2024    HYDROcodone -acetaminophen  5-325 MG tablet Commonly known as: NORCO/VICODIN Take 1-2 tablets by mouth every 4 (four) hours as needed for moderate pain (pain score 4-6).   magnesium  hydroxide 400 MG/5ML suspension Commonly known as: MILK OF MAGNESIA Take 30 mLs by mouth daily as needed for mild constipation.   multivitamin with minerals Tabs tablet Take 1 tablet by mouth daily. Start taking on: June 28, 2024   solifenacin 10 MG tablet Commonly known as: VESICARE Take 10 mg by mouth daily.   terazosin  5 MG capsule Commonly known as: HYTRIN  Take 5 mg by mouth every evening.               Durable Medical Equipment  (From admission, onward)           Start     Ordered   06/27/24 1546  For home use only DME standard manual wheelchair with seat cushion  Once       Comments: Patient suffers from Closed right hip fracture which impairs their ability to perform daily activities like bathing, dressing, feeding, grooming,  and toileting in the home.  A cane, crutch, or walker will not resolve issue with performing activities of daily living. A wheelchair will allow patient to safely perform daily activities. Patient can safely propel the wheelchair in the home or has a caregiver who can provide assistance. Length of need Lifetime. Accessories: elevating leg rests (ELRs), wheel locks, extensions and anti-tippers.   06/27/24 1546              Discharge Care Instructions  (From admission, onward)           Start     Ordered   06/27/24 0000  Leave dressing on - Keep it clean, dry, and intact until clinic visit        06/27/24 1556            Follow-up Information     Cleotilde Barrio, MD Follow up in 2 week(s).   Specialty: Orthopedic Surgery Why: For suture removal, For wound re-check, For re-evaluation  Please make an appointment for the patient prior to discharge Contact information: [address] [place] [address] [phone]          Auston Reyes BIRCH, MD. Schedule an appointment as soon as possible for a visit in 1 week(s).   Specialty: Internal Medicine Contact information: [facility] [address] [place] [address] [phone]                Discharge Exam: Andrew Vasquez   06/24/24 0500 06/25/24 0453 06/26/24 0416  Weight: 101.9 kg 106.9 kg 102.6 kg   General.  Overweight elderly man, in no acute distress. Pulmonary.  Lungs clear bilaterally, normal respiratory effort. CV.  Regular rate and rhythm, no JVD, rub or murmur. Abdomen.  Soft, nontender, nondistended, BS positive. CNS.  Alert and oriented .  No focal neurologic deficit. Extremities.  No edema,  pulses intact and symmetrical. Psychiatry.  Judgment and insight appears normal.   Condition at discharge: stable  The results of significant diagnostics from this hospitalization (including imaging, microbiology, ancillary and laboratory) are listed below for reference.   Imaging Studies: DG HIP UNILAT WITH PELVIS 2-3 VIEWS RIGHT Result Date: 06/23/2024 CLINICAL DATA:  Femur fracture. EXAM: DG HIP (WITH OR WITHOUT PELVIS) 2-3V RIGHT COMPARISON:  Preoperative imaging FINDINGS: Five fluoroscopic spot views of the right hip submitted from the operating room. Imaging during femoral intramedullary nail with trans trochanteric and distal locking screw fixation of proximal femur fracture. Fluoroscopy time 1 minutes 23 seconds. Dose 27.26 mGy. IMPRESSION: Intraoperative fluoroscopy during proximal femur fracture fixation. Electronically Signed   By: Andrea Gasman M.D.   On: 06/23/2024 10:59   DG C-Arm 1-60 Min-No Report Result Date: 06/23/2024 Fluoroscopy was utilized by the requesting physician.  No radiographic interpretation.   DG C-Arm 1-60 Min-No Report Result Date: 06/23/2024 Fluoroscopy was utilized by the requesting physician.  No radiographic interpretation.   DG Chest 1 View Result Date: 06/22/2024 CLINICAL DATA:  Status post  fall. EXAM: CHEST  1 VIEW COMPARISON:  August 02, 2010 FINDINGS: The heart size and mediastinal contours are within normal limits. No acute infiltrate, pleural effusion or pneumothorax is identified. Multilevel degenerative changes are seen throughout the thoracic spine. IMPRESSION: No active cardiopulmonary disease. Electronically Signed   By: Suzen Dials M.D.   On: 06/22/2024 22:01   DG HIP UNILAT WITH PELVIS 2-3 VIEWS RIGHT Result Date: 06/22/2024 CLINICAL DATA:  Status post fall. EXAM: DG HIP (WITH OR WITHOUT PELVIS) 2-3V RIGHT COMPARISON:  None Available.  FINDINGS: An acute, comminuted fracture deformity is seen extending through the inter trochanteric region of the proximal right femur. A chronic deformity of the left inferior pubic ramus is suspected. There is no evidence of dislocation. Degenerative changes are seen in the form of joint space narrowing and acetabular sclerosis. IMPRESSION: Acute, comminuted fracture of the proximal right femur. Electronically Signed   By: Suzen Dials M.D.   On: 06/22/2024 21:59    Microbiology: Results for orders placed or performed during the hospital encounter of 04/24/24  Surgical pcr screen     Status: Abnormal   Collection Time: 04/24/24  8:48 AM   Specimen: Nasal Mucosa; Nasal Swab  Result Value Ref Range Status   MRSA, PCR POSITIVE (A) NEGATIVE Final    Comment: RESULT CALLED TO, READ BACK BY AND VERIFIED WITH: BRYAN GRAY 04/24/24 1441 MW    Staphylococcus aureus POSITIVE (A) NEGATIVE Final    Comment: (NOTE) The Xpert SA Assay (FDA approved for NASAL specimens in patients 94 years of age and older), is one component of a comprehensive surveillance program. It is not intended to diagnose infection nor to guide or monitor treatment. Performed at [facility], [address] [place], [place] [address]     Labs: CBC: Recent Labs  Lab 06/22/24 2158 06/23/24 0510 06/24/24 0441 06/25/24 0517 06/26/24 0646  WBC 8.5  9.5 14.1* 10.4 11.6*  NEUTROABS 6.0  --   --   --   --   HGB 12.5* 11.9* 10.6* 10.3* 10.5*  HCT 39.1 36.3* 31.9* 32.4* 31.3*  MCV 94.7 91.4 90.1 93.6 90.5  PLT 93* 113* 127* 105* 129*   Basic Metabolic Panel: Recent Labs  Lab 06/22/24 2158 06/23/24 0510 06/24/24 0441 06/25/24 0517 06/26/24 0646  NA 139 142 138 138 137  K 3.5 3.4* 3.9 3.6 3.5  CL 101 102 101 96* 95*  CO2 26 31 29  33* 33*  GLUCOSE 140* 91 130* 93 112*  BUN 20 18 17 18 22   CREATININE 1.11 0.97 0.96 0.93 0.82  CALCIUM 9.0 8.6* 8.3* 8.6* 8.5*  MG  --   --  2.1  --   --   PHOS  --   --  3.7  --   --    Liver Function Tests: No results for input(s): AST, ALT, ALKPHOS, BILITOT, PROT, ALBUMIN in the last 168 hours. CBG: No results for input(s): GLUCAP in the last 168 hours.  Discharge time spent: greater than 30 minutes.  This record has been created using Conservation officer, historic buildings. Errors have been sought and corrected,but may not always be located. Such creation errors do not reflect on the standard of care.   Signed: Amaryllis Dare, MD [facility] 06/27/2024

## 2024-06-27 NOTE — Plan of Care (Signed)

## 2024-09-25 ENCOUNTER — Other Ambulatory Visit: Payer: Self-pay | Admitting: Orthopedic Surgery

## 2024-10-08 ENCOUNTER — Other Ambulatory Visit: Payer: Self-pay

## 2024-10-08 ENCOUNTER — Encounter
Admission: RE | Admit: 2024-10-08 | Discharge: 2024-10-08 | Disposition: A | Discharge status: Home / Self Care | Source: Ambulatory Visit | Attending: Orthopedic Surgery | Admitting: Orthopedic Surgery

## 2024-10-08 VITALS — Ht 72.0 in | Wt 234.0 lb

## 2024-10-08 DIAGNOSIS — Z01818 Encounter for other preprocedural examination: Secondary | ICD-10-CM

## 2024-10-08 HISTORY — DX: Gastro-esophageal reflux disease without esophagitis: K21.9

## 2024-10-08 NOTE — Patient Instructions (Addendum)
 Your procedure is scheduled on: Thursday 10/18/24 Report to the Registration Desk on the 1st floor of the [facility]. To find out your arrival time, please call [phone] between 1PM - 3PM on: Wednesday 10/17/24 If your arrival time is 6:00 am, do not arrive before that time as the [facility] entrance doors do not open until 6:00 am.  REMEMBER: Instructions that are not followed completely may result in serious medical risk, up to and including death; or upon the discretion of your surgeon and anesthesiologist your surgery may need to be rescheduled.  Do not eat food after midnight the night before surgery.  No gum chewing or hard candies.  You may however, drink CLEAR liquids up to 2 hours before you are scheduled to arrive for your surgery. Do not drink anything within 2 hours of your scheduled arrival time.  Clear liquids include: - water   - apple juice without pulp - gatorade (not RED colors) - black coffee or tea (Do NOT add milk or creamers to the coffee or tea) Do NOT drink anything that is not on this list.  One week prior to surgery: Stop Anti-inflammatories (NSAIDS) such as Advil, Aleve, Ibuprofen, Motrin, Naproxen, Naprosyn and Aspirin  based products such as Excedrin, Goody's Powder, BC Powder.  You may however, continue to take Tylenol  if needed for pain up until the day of surgery.  Stop ANY OVER THE COUNTER supplements and vitamins until after surgery.  **Follow recommendations regarding stopping blood thinners.** Stop Aspirin  starting on Thursday 10/11/24  Continue taking all of your other prescription medications up until the day of surgery.  ON THE DAY OF SURGERY ONLY TAKE THESE MEDICATIONS WITH SIPS OF WATER :  omeprazole (PRILOSEC) 40 MG capsule   No Alcohol for 24 hours before or after surgery.  No Smoking including e-cigarettes for 24 hours before surgery.  No chewable tobacco products for at least 6 hours before surgery.  No nicotine patches on the  day of surgery.  Do not use any recreational drugs for at least a week (preferably 2 weeks) before your surgery.  Please be advised that the combination of cocaine and anesthesia may have negative outcomes, up to and including death. If you test positive for cocaine, your surgery will be cancelled.  On the morning of surgery brush your teeth with toothpaste and water , you may rinse your mouth with mouthwash if you wish. Do not swallow any toothpaste or mouthwash.  Use CHG Soap or wipes as directed on instruction sheet.  Do not wear lotions, powders, or perfumes on the day of surgery  Do not shave body hair from the neck down 48 hours before surgery.  Wear comfortable clothing (specific to your surgery type) to the hospital.  Do not wear jewelry, make-up, hairpins, clips or nail polish.  For welded (permanent) jewelry: bracelets, anklets, waist bands, etc.  Please have this removed prior to surgery.  If it is not removed, there is a chance that hospital personnel will need to cut it off on the day of surgery.  Contact lenses, hearing aids and dentures may not be worn into surgery.  Do not bring valuables to the hospital. [facility] is not responsible for any missing/lost belongings or valuables.   Notify your doctor if there is any change in your medical condition (cold, fever, infection).  After surgery, you can help prevent lung complications by doing breathing exercises.  Take deep breaths and cough every 1-2 hours. Your doctor may order a device called an  Incentive Spirometer to help you take deep breaths.  If you are being admitted to the hospital overnight, leave your suitcase in the car. After surgery it may be brought to your room.  In case of increased patient census, it may be necessary for you, the patient, to continue your postoperative care in the Same Day Surgery department.  Please call the Pre-admissions Testing Dept. at [phone] if you have any questions  about these instructions.  Surgery Visitation Policy:  Patients having surgery or a procedure may have two visitors.  Children under the age of 40 must have an adult with them who is not the patient.  Inpatient Visitation:    Visiting hours are 7 a.m. to 8 p.m. Up to four visitors are allowed at one time in a patient room. The visitors may rotate out with other people during the day.  One visitor age 4 or older may stay with the patient overnight and must be in the room by 8 p.m.   Merchandiser, Retail to address health-related social needs:  [email or web address]    Pre-operative 4 CHG Bath Instructions   You can play a key role in reducing the risk of infection after surgery. Your skin needs to be as free of germs as possible. You can reduce the number of germs on your skin by washing with CHG (chlorhexidine  gluconate) soap before surgery. CHG is an antiseptic soap that kills germs and continues to kill germs even after washing.   DO NOT use if you have an allergy to chlorhexidine /CHG or antibacterial soaps. If your skin becomes reddened or irritated, stop using the CHG and notify one of our RNs at [phone].   Please shower with the CHG soap starting 4 days before surgery using the following schedule:   Sunday 10/14/24 - Wednesday 10/17/24    Please keep in mind the following:  DO NOT shave, including legs and underarms, starting the day of your first shower.   You may shave your face at any point before/day of surgery.  Place clean sheets on your bed the day you start using CHG soap. Use a clean washcloth (not used since being washed) for each shower. DO NOT sleep with pets once you start using the CHG.   CHG Shower Instructions:  If you choose to wash your hair and private area, wash first with your normal shampoo/soap.  After you use shampoo/soap, rinse your hair and body thoroughly to remove shampoo/soap residue.  Turn the water  OFF and apply about  3 tablespoons (45 ml) of CHG soap to a CLEAN washcloth.  Apply CHG soap ONLY FROM YOUR NECK DOWN TO YOUR TOES (washing for 3-5 minutes)  DO NOT use CHG soap on face, private areas, open wounds, or sores.  Pay special attention to the area where your surgery is being performed.  If you are having back surgery, having someone wash your back for you may be helpful. Wait 2 minutes after CHG soap is applied, then you may rinse off the CHG soap.  Pat dry with a clean towel  Put on clean clothes/pajamas   If you choose to wear lotion, please use ONLY the CHG-compatible lotions on the back of this paper.     Additional instructions for the day of surgery: DO NOT APPLY any lotions, deodorants, cologne, or perfumes.   Put on clean/comfortable clothes.  Brush your teeth.  Ask your nurse before applying any prescription medications to the skin.      CHG Compatible Lotions  Aveeno Moisturizing lotion  Cetaphil Moisturizing Cream  Cetaphil Moisturizing Lotion  Clairol Herbal Essence Moisturizing Lotion, Dry Skin  Clairol Herbal Essence Moisturizing Lotion, Extra Dry Skin  Clairol Herbal Essence Moisturizing Lotion, Normal Skin  Curel Age Defying Therapeutic Moisturizing Lotion with Alpha Hydroxy  Curel Extreme Care Body Lotion  Curel Soothing Hands Moisturizing Hand Lotion  Curel Therapeutic Moisturizing Cream, Fragrance-Free  Curel Therapeutic Moisturizing Lotion, Fragrance-Free  Curel Therapeutic Moisturizing Lotion, Original Formula  Eucerin Daily Replenishing Lotion  Eucerin Dry Skin Therapy Plus Alpha Hydroxy Crme  Eucerin Dry Skin Therapy Plus Alpha Hydroxy Lotion  Eucerin Original Crme  Eucerin Original Lotion  Eucerin Plus Crme Eucerin Plus Lotion  Eucerin TriLipid Replenishing Lotion  Keri Anti-Bacterial Hand Lotion  Keri Deep Conditioning Original Lotion Dry Skin Formula Softly Scented  Keri Deep Conditioning Original Lotion, Fragrance Free Sensitive Skin Formula  Keri  Lotion Fast Absorbing Fragrance Free Sensitive Skin Formula  Keri Lotion Fast Absorbing Softly Scented Dry Skin Formula  Keri Original Lotion  Keri Skin Renewal Lotion Keri Silky Smooth Lotion  Keri Silky Smooth Sensitive Skin Lotion  Nivea Body Creamy Conditioning Oil  Nivea Body Extra Enriched Lotion  Nivea Body Original Lotion  Nivea Body Sheer Moisturizing Lotion Nivea Crme  Nivea Skin Firming Lotion  NutraDerm 30 Skin Lotion  NutraDerm Skin Lotion  NutraDerm Therapeutic Skin Cream  NutraDerm Therapeutic Skin Lotion  ProShield Protective Hand Cream  Provon moisturizing lotion  How to Use an Incentive Spirometer  An incentive spirometer is a tool that measures how well you are filling your lungs with each breath. Learning to take long, deep breaths using this tool can help you keep your lungs clear and active. This may help to reverse or lessen your chance of developing breathing (pulmonary) problems, especially infection. You may be asked to use a spirometer: After a surgery. If you have a lung problem or a history of smoking. After a long period of time when you have been unable to move or be active. If the spirometer includes an indicator to show the highest number that you have reached, your health care provider or respiratory therapist will help you set a goal. Keep a log of your progress as told by your health care provider. What are the risks? Breathing too quickly may cause dizziness or cause you to pass out. Take your time so you do not get dizzy or light-headed. If you are in pain, you may need to take pain medicine before doing incentive spirometry. It is harder to take a deep breath if you are having pain. How to use your incentive spirometer  Sit up on the edge of your bed or on a chair. Hold the incentive spirometer so that it is in an upright position. Before you use the spirometer, breathe out normally. Place the mouthpiece in your mouth. Make sure your lips are  closed tightly around it. Breathe in slowly and as deeply as you can through your mouth, causing the piston or the ball to rise toward the top of the chamber. Hold your breath for 3-5 seconds, or for as long as possible. If the spirometer includes a coach indicator, use this to guide you in breathing. Slow down your breathing if the indicator goes above the marked areas. Remove the mouthpiece from your mouth and breathe out normally. The piston or ball will return to the bottom of the chamber. Rest for a few seconds, then repeat the steps 10 or more times. Take your time and  take a few normal breaths between deep breaths so that you do not get dizzy or light-headed. Do this every 1-2 hours when you are awake. If the spirometer includes a goal marker to show the highest number you have reached (best effort), use this as a goal to work toward during each repetition. After each set of 10 deep breaths, cough a few times. This will help to make sure that your lungs are clear. If you have an incision on your chest or abdomen from surgery, place a pillow or a rolled-up towel firmly against the incision when you cough. This can help to reduce pain while taking deep breaths and coughing. General tips When you are able to get out of bed: Walk around often. Continue to take deep breaths and cough in order to clear your lungs. Keep using the incentive spirometer until your health care provider says it is okay to stop using it. If you have been in the hospital, you may be told to keep using the spirometer at home. Contact a health care provider if: You are having difficulty using the spirometer. You have trouble using the spirometer as often as instructed. Your pain medicine is not giving enough relief for you to use the spirometer as told. You have a fever. Get help right away if: You develop shortness of breath. You develop a cough with bloody mucus from the lungs. You have fluid or blood coming from an  incision site after you cough. Summary An incentive spirometer is a tool that can help you learn to take long, deep breaths to keep your lungs clear and active. You may be asked to use a spirometer after a surgery, if you have a lung problem or a history of smoking, or if you have been inactive for a long period of time. Use your incentive spirometer as instructed every 1-2 hours while you are awake. If you have an incision on your chest or abdomen, place a pillow or a rolled-up towel firmly against your incision when you cough. This will help to reduce pain. Get help right away if you have shortness of breath, you cough up bloody mucus, or blood comes from your incision when you cough. This information is not intended to replace advice given to you by your health care provider. Make sure you discuss any questions you have with your health care provider. Document Revised: 02/11/2020 Document Reviewed: 02/11/2020 Elsevier Patient Education  2023 [employer].

## 2024-10-09 ENCOUNTER — Encounter
Admission: RE | Admit: 2024-10-09 | Discharge: 2024-10-09 | Disposition: A | Discharge status: Home / Self Care | Source: Ambulatory Visit | Attending: Orthopedic Surgery | Admitting: Orthopedic Surgery

## 2024-10-09 DIAGNOSIS — Z01818 Encounter for other preprocedural examination: Secondary | ICD-10-CM

## 2024-10-09 DIAGNOSIS — Z01812 Encounter for preprocedural laboratory examination: Secondary | ICD-10-CM | POA: Diagnosis present

## 2024-10-09 LAB — URINALYSIS, ROUTINE W REFLEX MICROSCOPIC
Bilirubin Urine: NEGATIVE
Glucose, UA: NEGATIVE mg/dL
Hgb urine dipstick: NEGATIVE
Ketones, ur: NEGATIVE mg/dL
Leukocytes,Ua: NEGATIVE
Nitrite: NEGATIVE
Protein, ur: NEGATIVE mg/dL
Specific Gravity, Urine: 1.009 (ref 1.005–1.030)
pH: 5 (ref 5.0–8.0)

## 2024-10-09 LAB — SURGICAL PCR SCREEN
MRSA, PCR: NEGATIVE
Staphylococcus aureus: NEGATIVE

## 2024-10-16 NOTE — Anesthesia Preprocedure Evaluation (Signed)
 Anesthesia Evaluation  Patient identified by MRN, date of birth, ID band Patient awake    Reviewed: Allergy & Precautions, H&P , NPO status , Patient's Chart, lab work & pertinent test results  Airway Mallampati: III  TM Distance: >3 FB Neck ROM: full    Dental  (+) Chipped, Missing,    Pulmonary neg shortness of breath, former smoker   Pulmonary exam normal        Cardiovascular Exercise Tolerance: Good hypertension, (-) angina (-) Orthopnea and (-) DOE Normal cardiovascular exam+ dysrhythmias (EKG SINUS BRADY)   TTE performed on 05/02/2024 revealed a normal left ventricular systolic function with an EF of >55%. There was mild apical LVH.  There were no regional wall motion abnormalities.  Left ventricular diastolic Doppler parameters were normal.  GLS -12.9%. Left atrium was mildly enlarged.  Right ventricular size and function normal with a TAPSE measuring 2.4 cm  (normal range >/= 1.6 cm).  RVSP = 33 mmHg.  There was trivial mitral and tricuspid valve regurgitation. All transvalvular gradients were noted to be normal providing no evidence suggestive of valvular stenosis. Aorta normal in size with no evidence of ectasia or aneurysmal dilatation.   Neuro/Psych  PSYCHIATRIC DISORDERS Anxiety     Chronic pain syndrome      GI/Hepatic Neg liver ROS,GERD  Controlled,,  Endo/Other  negative endocrine ROS    Renal/GU      Musculoskeletal  (+) Arthritis ,    Abdominal  (+) + obese  Peds  Hematology  (+) Blood dyscrasia thrombocytopenia   Anesthesia Other Findings Past Medical History: No date: Anxiety     Comment:  a.) on BZO (clonazepam ) PRN No date: Arthritis No date: Back pain No date: Depression 12/18/2020: History of dysplastic nevus     Comment:  left upper back, mild No date: History of kidney stones No date: Hypertension No date: Internal hemorrhoids 04/24/2024: Nose colonized with MRSA     Comment:  a.)  presurgical PCR (+) 04/24/2024 prior to RIGHT TKA No date: Pneumonia No date: Reflux esophagitis No date: Thrombocytopenia (HCC) No date: Tubular adenoma  Past Surgical History: No date: COLONOSCOPY No date: KNEE SURGERY 01/09/2015: LUMBAR LAMINECTOMY/DECOMPRESSION MICRODISCECTOMY; Left     Comment:  Procedure: LUMBAR FOUR-FIVE LAMINECTOMY/DECOMPRESSION               MICRODISCECTOMY 1 LEVEL;  Surgeon: Rockey Peru, MD;                Location: [facility];  Service: Neurosurgery;                Laterality: Left;  Left L45 microdiskectomy No date: VASECTOMY     Reproductive/Obstetrics negative OB ROS                              Anesthesia Physical Anesthesia Plan  ASA: 2  Anesthesia Plan: Spinal   Post-op Pain Management: Regional block*   Induction: Intravenous  PONV Risk Score and Plan: Propofol  infusion  Airway Management Planned: Natural Airway  Additional Equipment:   Intra-op Plan:   Post-operative Plan:   Informed Consent: I have reviewed the patients History and Physical, chart, labs and discussed the procedure including the risks, benefits and alternatives for the proposed anesthesia with the patient or authorized representative who has indicated his/her understanding and acceptance.       Plan Discussed with:   Anesthesia Plan Comments:  Anesthesia Quick Evaluation

## 2024-10-18 ENCOUNTER — Encounter
Admission: RE | Disposition: A | Discharge status: Home Health | Payer: Self-pay | Source: Ambulatory Visit | Attending: Orthopedic Surgery

## 2024-10-18 ENCOUNTER — Encounter: Payer: Self-pay | Admitting: Orthopedic Surgery

## 2024-10-18 ENCOUNTER — Ambulatory Visit

## 2024-10-18 ENCOUNTER — Ambulatory Visit: Payer: Self-pay | Admitting: Urgent Care

## 2024-10-18 ENCOUNTER — Other Ambulatory Visit: Payer: Self-pay

## 2024-10-18 ENCOUNTER — Ambulatory Visit
Admission: RE | Admit: 2024-10-18 | Discharge: 2024-10-19 | Disposition: A | Discharge status: Home Health | Source: Ambulatory Visit | Attending: Orthopedic Surgery | Admitting: Orthopedic Surgery

## 2024-10-18 DIAGNOSIS — K219 Gastro-esophageal reflux disease without esophagitis: Secondary | ICD-10-CM | POA: Insufficient documentation

## 2024-10-18 DIAGNOSIS — Z96651 Presence of right artificial knee joint: Secondary | ICD-10-CM | POA: Diagnosis not present

## 2024-10-18 DIAGNOSIS — M1712 Unilateral primary osteoarthritis, left knee: Secondary | ICD-10-CM | POA: Insufficient documentation

## 2024-10-18 DIAGNOSIS — F419 Anxiety disorder, unspecified: Secondary | ICD-10-CM | POA: Diagnosis not present

## 2024-10-18 DIAGNOSIS — I10 Essential (primary) hypertension: Secondary | ICD-10-CM | POA: Insufficient documentation

## 2024-10-18 DIAGNOSIS — Z87891 Personal history of nicotine dependence: Secondary | ICD-10-CM | POA: Diagnosis not present

## 2024-10-18 DIAGNOSIS — Z96652 Presence of left artificial knee joint: Secondary | ICD-10-CM

## 2024-10-18 HISTORY — PX: TOTAL KNEE ARTHROPLASTY: SHX125

## 2024-10-18 SURGERY — ARTHROPLASTY, KNEE, TOTAL
Anesthesia: Spinal | Site: Knee | Laterality: Left

## 2024-10-18 MED ORDER — ACETAMINOPHEN 10 MG/ML IV SOLN
INTRAVENOUS | Status: AC
  Filled 2024-10-18: qty 100

## 2024-10-18 MED ORDER — ONDANSETRON HCL 4 MG/2ML IJ SOLN
INTRAMUSCULAR | Status: AC
  Filled 2024-10-18: qty 2

## 2024-10-18 MED ORDER — PANTOPRAZOLE SODIUM 40 MG PO TBEC
40.0000 mg | DELAYED_RELEASE_TABLET | Freq: Every day | ORAL | Status: DC
  Administered 2024-10-18 – 2024-10-19 (×2): 40 mg via ORAL
  Filled 2024-10-18 (×2): qty 1

## 2024-10-18 MED ORDER — ONDANSETRON HCL 4 MG/2ML IJ SOLN
INTRAMUSCULAR | Status: AC
Start: 2024-10-18 — End: 2024-10-18
  Filled 2024-10-18: qty 2

## 2024-10-18 MED ORDER — KETAMINE HCL 50 MG/5ML IJ SOSY
PREFILLED_SYRINGE | INTRAMUSCULAR | Status: AC
  Filled 2024-10-18: qty 5

## 2024-10-18 MED ORDER — MORPHINE SULFATE (PF) 2 MG/ML IV SOLN: 0.5000 mg | INTRAVENOUS | Status: DC | PRN

## 2024-10-18 MED ORDER — PHENOL 1.4 % MT LIQD: 1.0000 | OROMUCOSAL | Status: DC | PRN

## 2024-10-18 MED ORDER — DEXAMETHASONE SOD PHOSPHATE PF 10 MG/ML IJ SOLN
8.0000 mg | Freq: Once | INTRAMUSCULAR | Status: AC
  Administered 2024-10-18: 10 mg via INTRAVENOUS

## 2024-10-18 MED ORDER — FENTANYL CITRATE (PF) 100 MCG/2ML IJ SOLN
INTRAMUSCULAR | Status: AC
  Filled 2024-10-18: qty 2

## 2024-10-18 MED ORDER — KETOROLAC TROMETHAMINE 15 MG/ML IJ SOLN
7.5000 mg | Freq: Four times a day (QID) | INTRAMUSCULAR | Status: DC
  Administered 2024-10-18 – 2024-10-19 (×3): 7.5 mg via INTRAVENOUS
  Filled 2024-10-18 (×3): qty 1

## 2024-10-18 MED ORDER — ORAL CARE MOUTH RINSE: 15.0000 mL | Freq: Once | OROMUCOSAL | Status: AC

## 2024-10-18 MED ORDER — PHENYLEPHRINE 80 MCG/ML (10ML) SYRINGE FOR IV PUSH (FOR BLOOD PRESSURE SUPPORT)
PREFILLED_SYRINGE | INTRAVENOUS | Status: AC
Start: 2024-10-18 — End: 2024-10-18
  Filled 2024-10-18: qty 10

## 2024-10-18 MED ORDER — OXYCODONE HCL 5 MG PO TABS
5.0000 mg | ORAL_TABLET | Freq: Three times a day (TID) | ORAL | 0 refills | Status: AC | PRN
  Filled 2024-10-18: qty 30, 10d supply, fill #0

## 2024-10-18 MED ORDER — HYDRALAZINE HCL 20 MG/ML IJ SOLN
10.0000 mg | Freq: Once | INTRAMUSCULAR | Status: AC
  Administered 2024-10-18: 10 mg via INTRAVENOUS

## 2024-10-18 MED ORDER — OXYCODONE HCL 5 MG PO TABS
5.0000 mg | ORAL_TABLET | Freq: Once | ORAL | Status: AC | PRN
  Administered 2024-10-18: 5 mg via ORAL

## 2024-10-18 MED ORDER — CEFAZOLIN SODIUM-DEXTROSE 2-4 GM/100ML-% IV SOLN
2.0000 g | Freq: Four times a day (QID) | INTRAVENOUS | Status: AC
  Administered 2024-10-18 (×2): 2 g via INTRAVENOUS
  Filled 2024-10-18: qty 100

## 2024-10-18 MED ORDER — ACETAMINOPHEN 10 MG/ML IV SOLN
INTRAVENOUS | Status: DC | PRN
Start: 2024-10-18 — End: 2024-10-18
  Administered 2024-10-18: 1000 mg via INTRAVENOUS

## 2024-10-18 MED ORDER — ACETAMINOPHEN 325 MG PO TABS: 325.0000 mg | ORAL_TABLET | Freq: Four times a day (QID) | ORAL | Status: DC | PRN

## 2024-10-18 MED ORDER — DEXMEDETOMIDINE HCL IN NACL 80 MCG/20ML IV SOLN
INTRAVENOUS | Status: DC | PRN
  Administered 2024-10-18 (×2): 6 ug via INTRAVENOUS

## 2024-10-18 MED ORDER — BISOPROLOL FUMARATE 5 MG PO TABS
5.0000 mg | ORAL_TABLET | Freq: Once | ORAL | Status: AC
  Administered 2024-10-18: 5 mg via ORAL
  Filled 2024-10-18: qty 1

## 2024-10-18 MED ORDER — TRAMADOL HCL 50 MG PO TABS: 50.0000 mg | ORAL_TABLET | Freq: Four times a day (QID) | ORAL | Status: DC | PRN

## 2024-10-18 MED ORDER — CEFAZOLIN SODIUM-DEXTROSE 2-4 GM/100ML-% IV SOLN
INTRAVENOUS | Status: AC
  Filled 2024-10-18: qty 100

## 2024-10-18 MED ORDER — ONDANSETRON HCL 4 MG/2ML IJ SOLN
INTRAMUSCULAR | Status: DC | PRN
  Administered 2024-10-18: 4 mg via INTRAVENOUS

## 2024-10-18 MED ORDER — MIDAZOLAM HCL 2 MG/2ML IJ SOLN
INTRAMUSCULAR | Status: AC
  Filled 2024-10-18: qty 2

## 2024-10-18 MED ORDER — OXYCODONE HCL 5 MG/5ML PO SOLN: 5.0000 mg | Freq: Once | ORAL | Status: AC | PRN

## 2024-10-18 MED ORDER — ACETAMINOPHEN 500 MG PO TABS
1000.0000 mg | ORAL_TABLET | Freq: Three times a day (TID) | ORAL | Status: DC
  Administered 2024-10-18 – 2024-10-19 (×2): 1000 mg via ORAL
  Filled 2024-10-18 (×3): qty 2

## 2024-10-18 MED ORDER — GLYCOPYRROLATE 0.2 MG/ML IJ SOLN
INTRAMUSCULAR | Status: DC | PRN
Start: 2024-10-18 — End: 2024-10-18
  Administered 2024-10-18: .2 mg via INTRAVENOUS

## 2024-10-18 MED ORDER — FESOTERODINE FUMARATE ER 4 MG PO TB24
4.0000 mg | ORAL_TABLET | Freq: Every day | ORAL | Status: DC
  Administered 2024-10-19: 4 mg via ORAL
  Filled 2024-10-18: qty 1

## 2024-10-18 MED ORDER — TAMSULOSIN HCL 0.4 MG PO CAPS
0.4000 mg | ORAL_CAPSULE | Freq: Every day | ORAL | Status: DC
  Administered 2024-10-18: 0.4 mg via ORAL
  Filled 2024-10-18: qty 1

## 2024-10-18 MED ORDER — METOCLOPRAMIDE HCL 5 MG/ML IJ SOLN: 5.0000 mg | Freq: Three times a day (TID) | INTRAMUSCULAR | Status: DC | PRN

## 2024-10-18 MED ORDER — TRANEXAMIC ACID-NACL 1000-0.7 MG/100ML-% IV SOLN
1000.0000 mg | INTRAVENOUS | Status: AC
  Administered 2024-10-18 (×2): 1000 mg via INTRAVENOUS

## 2024-10-18 MED ORDER — CELECOXIB 200 MG PO CAPS
200.0000 mg | ORAL_CAPSULE | Freq: Two times a day (BID) | ORAL | 0 refills | Status: AC
  Filled 2024-10-18: qty 28, 14d supply, fill #0

## 2024-10-18 MED ORDER — DEXMEDETOMIDINE HCL IN NACL 80 MCG/20ML IV SOLN
INTRAVENOUS | Status: AC
  Filled 2024-10-18: qty 20

## 2024-10-18 MED ORDER — MENTHOL 3 MG MT LOZG
1.0000 | LOZENGE | OROMUCOSAL | Status: DC | PRN
  Administered 2024-10-18: 3 mg via ORAL
  Filled 2024-10-18: qty 9

## 2024-10-18 MED ORDER — TRANEXAMIC ACID-NACL 1000-0.7 MG/100ML-% IV SOLN
INTRAVENOUS | Status: AC
  Filled 2024-10-18: qty 100

## 2024-10-18 MED ORDER — SODIUM CHLORIDE 0.9 % IR SOLN
Status: DC | PRN
  Administered 2024-10-18: 3000 mL

## 2024-10-18 MED ORDER — ONDANSETRON HCL 4 MG/2ML IJ SOLN: 4.0000 mg | Freq: Four times a day (QID) | INTRAMUSCULAR | Status: DC | PRN

## 2024-10-18 MED ORDER — MIDAZOLAM HCL 5 MG/5ML IJ SOLN
INTRAMUSCULAR | Status: DC | PRN
  Administered 2024-10-18 (×2): 1 mg via INTRAVENOUS

## 2024-10-18 MED ORDER — FENTANYL CITRATE (PF) 100 MCG/2ML IJ SOLN
INTRAMUSCULAR | Status: DC | PRN
  Administered 2024-10-18 (×2): 50 ug via INTRAVENOUS

## 2024-10-18 MED ORDER — ENOXAPARIN SODIUM 40 MG/0.4ML IJ SOSY
40.0000 mg | PREFILLED_SYRINGE | INTRAMUSCULAR | 0 refills | Status: AC
  Filled 2024-10-18: qty 5.6, 14d supply, fill #0

## 2024-10-18 MED ORDER — LIDOCAINE HCL (PF) 2 % IJ SOLN
INTRAMUSCULAR | Status: AC
  Filled 2024-10-18: qty 5

## 2024-10-18 MED ORDER — HYDRALAZINE HCL 20 MG/ML IJ SOLN
INTRAMUSCULAR | Status: AC
  Filled 2024-10-18: qty 1

## 2024-10-18 MED ORDER — LIDOCAINE HCL (CARDIAC) PF 100 MG/5ML IV SOSY
PREFILLED_SYRINGE | INTRAVENOUS | Status: DC | PRN
  Administered 2024-10-18: 100 mg via INTRAVENOUS

## 2024-10-18 MED ORDER — ONDANSETRON HCL 4 MG PO TABS: 4.0000 mg | ORAL_TABLET | Freq: Four times a day (QID) | ORAL | Status: DC | PRN

## 2024-10-18 MED ORDER — KETAMINE HCL 50 MG/5ML IJ SOSY
PREFILLED_SYRINGE | INTRAMUSCULAR | Status: DC | PRN
  Administered 2024-10-18: 20 mg via INTRAVENOUS
  Administered 2024-10-18: 30 mg via INTRAVENOUS

## 2024-10-18 MED ORDER — METOCLOPRAMIDE HCL 10 MG PO TABS: 5.0000 mg | ORAL_TABLET | Freq: Three times a day (TID) | ORAL | Status: DC | PRN

## 2024-10-18 MED ORDER — DOCUSATE SODIUM 100 MG PO CAPS
100.0000 mg | ORAL_CAPSULE | Freq: Two times a day (BID) | ORAL | Status: DC
  Administered 2024-10-18 – 2024-10-19 (×3): 100 mg via ORAL
  Filled 2024-10-18 (×3): qty 1

## 2024-10-18 MED ORDER — HYDROCODONE-ACETAMINOPHEN 5-325 MG PO TABS: 1.0000 | ORAL_TABLET | ORAL | Status: DC | PRN

## 2024-10-18 MED ORDER — OXYCODONE HCL 5 MG PO TABS
ORAL_TABLET | ORAL | Status: AC
  Filled 2024-10-18: qty 1

## 2024-10-18 MED ORDER — DOCUSATE SODIUM 100 MG PO CAPS
100.0000 mg | ORAL_CAPSULE | Freq: Two times a day (BID) | ORAL | 0 refills | Status: AC
  Filled 2024-10-18: qty 10, 5d supply, fill #0

## 2024-10-18 MED ORDER — BISOPROLOL FUMARATE 5 MG PO TABS
5.0000 mg | ORAL_TABLET | Freq: Every day | ORAL | Status: DC
  Administered 2024-10-18 – 2024-10-19 (×2): 5 mg via ORAL
  Filled 2024-10-18 (×2): qty 1

## 2024-10-18 MED ORDER — SODIUM CHLORIDE 0.9 % IV SOLN: INTRAVENOUS | Status: DC

## 2024-10-18 MED ORDER — ACETAMINOPHEN 10 MG/ML IV SOLN: 1000.0000 mg | Freq: Once | INTRAVENOUS | Status: DC | PRN

## 2024-10-18 MED ORDER — SUGAMMADEX SODIUM 200 MG/2ML IV SOLN
INTRAVENOUS | Status: DC | PRN
  Administered 2024-10-18: 200 mg via INTRAVENOUS

## 2024-10-18 MED ORDER — PROPOFOL 1000 MG/100ML IV EMUL
INTRAVENOUS | Status: AC
  Filled 2024-10-18: qty 100

## 2024-10-18 MED ORDER — SODIUM CHLORIDE (PF) 0.9 % IJ SOLN
INTRAMUSCULAR | Status: DC | PRN
  Administered 2024-10-18: 71 mL via INTRAMUSCULAR

## 2024-10-18 MED ORDER — ROCURONIUM BROMIDE 10 MG/ML (PF) SYRINGE
PREFILLED_SYRINGE | INTRAVENOUS | Status: AC
Start: 2024-10-18 — End: 2024-10-18
  Filled 2024-10-18: qty 10

## 2024-10-18 MED ORDER — ROCURONIUM BROMIDE 100 MG/10ML IV SOLN
INTRAVENOUS | Status: DC | PRN
  Administered 2024-10-18: 60 mg via INTRAVENOUS
  Administered 2024-10-18: 10 mg via INTRAVENOUS

## 2024-10-18 MED ORDER — PROPOFOL 10 MG/ML IV BOLUS
INTRAVENOUS | Status: DC | PRN
  Administered 2024-10-18: 50 mg via INTRAVENOUS
  Administered 2024-10-18: 150 mg via INTRAVENOUS

## 2024-10-18 MED ORDER — CALCIUM CARBONATE ANTACID 500 MG PO CHEW
2.0000 | CHEWABLE_TABLET | Freq: Once | ORAL | Status: AC
  Administered 2024-10-18 – 2024-10-19 (×2): 400 mg via ORAL
  Filled 2024-10-18: qty 2

## 2024-10-18 MED ORDER — SURGIPHOR WOUND IRRIGATION SYSTEM - OPTIME: TOPICAL | Status: DC | PRN

## 2024-10-18 MED ORDER — EPHEDRINE 5 MG/ML INJ
INTRAVENOUS | Status: AC
  Filled 2024-10-18: qty 5

## 2024-10-18 MED ORDER — HYDROCHLOROTHIAZIDE 12.5 MG PO TABS
6.2500 mg | ORAL_TABLET | Freq: Every day | ORAL | Status: DC
  Administered 2024-10-18 – 2024-10-19 (×2): 6.25 mg via ORAL
  Filled 2024-10-18 (×2): qty 1

## 2024-10-18 MED ORDER — ENOXAPARIN SODIUM 30 MG/0.3ML IJ SOSY
30.0000 mg | PREFILLED_SYRINGE | Freq: Two times a day (BID) | INTRAMUSCULAR | Status: DC
Start: 2024-10-19 — End: 2024-10-19
  Administered 2024-10-19: 30 mg via SUBCUTANEOUS
  Filled 2024-10-18: qty 0.3

## 2024-10-18 MED ORDER — FENTANYL CITRATE (PF) 100 MCG/2ML IJ SOLN
25.0000 ug | INTRAMUSCULAR | Status: DC | PRN
  Administered 2024-10-18 (×2): 25 ug via INTRAVENOUS

## 2024-10-18 MED ORDER — GLYCOPYRROLATE 0.2 MG/ML IJ SOLN
INTRAMUSCULAR | Status: AC
  Filled 2024-10-18: qty 1

## 2024-10-18 MED ORDER — ACETAMINOPHEN 500 MG PO TABS
1000.0000 mg | ORAL_TABLET | Freq: Three times a day (TID) | ORAL | 0 refills | Status: AC
  Filled 2024-10-18: qty 30, 5d supply, fill #0

## 2024-10-18 MED ORDER — ONDANSETRON HCL 4 MG PO TABS
4.0000 mg | ORAL_TABLET | Freq: Four times a day (QID) | ORAL | 0 refills | Status: AC | PRN
  Filled 2024-10-18: qty 20, 5d supply, fill #0

## 2024-10-18 MED ORDER — EPHEDRINE SULFATE-NACL 50-0.9 MG/10ML-% IV SOSY
PREFILLED_SYRINGE | INTRAVENOUS | Status: DC | PRN
  Administered 2024-10-18: 5 mg via INTRAVENOUS
  Administered 2024-10-18: 10 mg via INTRAVENOUS

## 2024-10-18 MED ORDER — CEFAZOLIN SODIUM-DEXTROSE 2-4 GM/100ML-% IV SOLN
2.0000 g | INTRAVENOUS | Status: AC
  Administered 2024-10-18: 2 g via INTRAVENOUS

## 2024-10-18 MED ORDER — DROPERIDOL 2.5 MG/ML IJ SOLN: 0.6250 mg | Freq: Once | INTRAMUSCULAR | Status: DC | PRN

## 2024-10-18 MED ORDER — LACTATED RINGERS IV SOLN: INTRAVENOUS | Status: DC

## 2024-10-18 MED ORDER — CHLORHEXIDINE GLUCONATE 0.12 % MT SOLN
15.0000 mL | Freq: Once | OROMUCOSAL | Status: AC
  Administered 2024-10-18: 15 mL via OROMUCOSAL

## 2024-10-18 MED ORDER — PROPOFOL 10 MG/ML IV BOLUS
INTRAVENOUS | Status: AC
  Filled 2024-10-18: qty 20

## 2024-10-18 MED ORDER — CHLORHEXIDINE GLUCONATE 0.12 % MT SOLN
OROMUCOSAL | Status: AC
  Filled 2024-10-18: qty 15

## 2024-10-18 MED ORDER — TRAMADOL HCL 50 MG PO TABS
50.0000 mg | ORAL_TABLET | Freq: Four times a day (QID) | ORAL | 0 refills | Status: AC | PRN
  Filled 2024-10-18: qty 30, 8d supply, fill #0

## 2024-10-18 MED ORDER — BISOPROLOL-HYDROCHLOROTHIAZIDE 5-6.25 MG PO TABS: 1.0000 | ORAL_TABLET | Freq: Every day | ORAL | Status: DC

## 2024-10-18 SURGICAL SUPPLY — 57 items
BLADE PATELLA W-PILOT HOLE 35 (BLADE) IMPLANT
BLADE SAW 90X13X1.19 OSCILLAT (BLADE) IMPLANT
BLADE SAW SAG 25X90X1.19 (BLADE) ×1 IMPLANT
BLADE SAW SAG 29X58X.64 (BLADE) ×1 IMPLANT
BNDG ELASTIC 6INX 5YD STR LF (GAUZE/BANDAGES/DRESSINGS) ×1 IMPLANT
BOWL CEMENT MIX W/ADAPTER (MISCELLANEOUS) IMPLANT
BRUSH SCRUB EZ PLAIN DRY (MISCELLANEOUS) IMPLANT
CHLORAPREP W/TINT 26 (MISCELLANEOUS) ×2 IMPLANT
COMPONENT FEM CR PS 12 LT (Joint) IMPLANT
COMPONENT PATELLA 3 PEG 38 (Joint) IMPLANT
COMPONENT TIB KNEE H 0D LT (Joint) IMPLANT
COOLER ICEMAN CLASSIC (MISCELLANEOUS) ×1 IMPLANT
CUFF TRNQT CYL 24X4X16.5-23 (TOURNIQUET CUFF) IMPLANT
CUFF TRNQT CYL 30X4X21-28X (TOURNIQUET CUFF) IMPLANT
DERMABOND ADVANCED .7 DNX12 (GAUZE/BANDAGES/DRESSINGS) ×1 IMPLANT
DRAPE SHEET LG 3/4 BI-LAMINATE (DRAPES) ×2 IMPLANT
DRSG MEPILEX SACRM 8.7X9.8 (GAUZE/BANDAGES/DRESSINGS) ×1 IMPLANT
DRSG OPSITE POSTOP 4X10 (GAUZE/BANDAGES/DRESSINGS) IMPLANT
DRSG OPSITE POSTOP 4X8 (GAUZE/BANDAGES/DRESSINGS) IMPLANT
ELECTRODE REM PT RTRN 9FT ADLT (ELECTROSURGICAL) ×1 IMPLANT
GLOVE BIO SURGEON STRL SZ8 (GLOVE) ×1 IMPLANT
GLOVE BIOGEL PI IND STRL 8 (GLOVE) ×1 IMPLANT
GLOVE PI ORTHO PRO STRL 7.5 (GLOVE) ×2 IMPLANT
GLOVE PI ORTHO PRO STRL SZ8 (GLOVE) ×2 IMPLANT
GLOVE SURG SYN 7.5 PF PI (GLOVE) ×1 IMPLANT
GOWN SRG XL LONG LVL 3 NONREIN (GOWNS) ×1 IMPLANT
GOWN SRG XL LVL 3 NONREINFORCE (GOWNS) ×1 IMPLANT
GOWN STRL REUS W/ TWL LRG LVL3 (GOWN DISPOSABLE) ×1 IMPLANT
HOOD PEEL AWAY T7 (MISCELLANEOUS) ×2 IMPLANT
KIT TURNOVER KIT A (KITS) ×1 IMPLANT
LINER TIB ASF GH/12 10 LT (Liner) IMPLANT
MANIFOLD NEPTUNE II (INSTRUMENTS) ×1 IMPLANT
MARKER SKIN DUAL TIP RULER LAB (MISCELLANEOUS) ×1 IMPLANT
MAT ABSORB FLUID 56X50 GRAY (MISCELLANEOUS) ×1 IMPLANT
NDL HYPO 21X1.5 SAFETY (NEEDLE) ×1 IMPLANT
NEEDLE HYPO 21X1.5 SAFETY (NEEDLE) ×1 IMPLANT
PACK TOTAL KNEE (MISCELLANEOUS) ×1 IMPLANT
PAD ARMBOARD POSITIONER FOAM (MISCELLANEOUS) ×3 IMPLANT
PAD COLD UNI WRAP-ON (PAD) ×1 IMPLANT
PENCIL SMOKE EVACUATOR (MISCELLANEOUS) ×1 IMPLANT
PIN DRILL HDLS TROCAR 75 4PK (PIN) IMPLANT
SCREW FEMALE HEX FIX 25X2.5 (ORTHOPEDIC DISPOSABLE SUPPLIES) IMPLANT
SCREW HEX HEADED 3.5X27 DISP (ORTHOPEDIC DISPOSABLE SUPPLIES) IMPLANT
SLEEVE SCD COMPRESS KNEE MED (STOCKING) ×1 IMPLANT
SOL .9 NS 3000ML IRR UROMATIC (IV SOLUTION) ×1 IMPLANT
SOLN STERILE WATER BTL 1000 ML (IV SOLUTION) ×1 IMPLANT
SOLUTION IRRIG SURGIPHOR (IV SOLUTION) ×1 IMPLANT
STOCKINETTE IMPERV 14X48 (MISCELLANEOUS) ×1 IMPLANT
SUT STRATAFIX 14 PDO 36 VLT (SUTURE) ×1 IMPLANT
SUT VIC AB 0 CT1 36 (SUTURE) ×1 IMPLANT
SUT VIC AB 2-0 CT2 27 (SUTURE) ×2 IMPLANT
SUTURE STRATA SPIR 4-0 18 (SUTURE) ×1 IMPLANT
SUTURE VICRYL 1-0 27IN ABS (SUTURE) ×1 IMPLANT
SYR 20ML LL LF (SYRINGE) ×2 IMPLANT
TAPE CLOTH 3X10 WHT NS LF (GAUZE/BANDAGES/DRESSINGS) ×1 IMPLANT
TIP FAN IRRIG PULSAVAC PLUS (DISPOSABLE) ×1 IMPLANT
TRAP FLUID SMOKE EVACUATOR (MISCELLANEOUS) ×1 IMPLANT

## 2024-10-18 NOTE — Transfer of Care (Signed)
 Immediate Anesthesia Transfer of Care Note  Patient: Andrew Vasquez.  Procedure(s) Performed: ARTHROPLASTY, KNEE, TOTAL (Left: Knee)  Patient Location: PACU  Anesthesia Type:General  Level of Consciousness: drowsy  Airway & Oxygen Therapy: Patient Spontanous Breathing and Patient connected to face mask oxygen  Post-op Assessment: Report given to RN and Post -op Vital signs reviewed and stable  Post vital signs: Reviewed and stable  Last Vitals:  Vitals Value Taken Time  BP 155/78 10/18/24 11:45  Temp    Pulse 72 10/18/24 11:46  Resp 17 10/18/24 11:46  SpO2 99 % 10/18/24 11:46  Vitals shown include unfiled device data.  Last Pain:  Vitals:   10/18/24 0827  TempSrc: Temporal  PainSc: 0-No pain         Complications: No notable events documented.

## 2024-10-18 NOTE — Interval H&P Note (Signed)
 Patient history and physical updated. Consent reviewed including risks, benefits, and alternatives to surgery. Patient agrees with above plan to proceed with left total knee arthroplasty.

## 2024-10-18 NOTE — Evaluation (Signed)
 Physical Therapy Evaluation Patient Details Name: Andrew SAMFORD Sr. MRN: [id number] DOB: 09-25-1946 Today's Date: 10/18/2024  History of Present Illness  Patient is 78 year old s/p L TKA. R TKA 05/07/24, R hip fx in August.  Clinical Impression  Patient received in bed, he is agreeable to PT assessment. Patient requires min A for bed mobility ( moving L LE). Min A for sit to stand from slightly elevated bed. He was able to ambulate 10 feet with RW and supervision. Patient will continue to benefit from skilled PT to improve mobility, safety and strength.         If plan is discharge home, recommend the following: A little help with walking and/or transfers;A little help with bathing/dressing/bathroom;Assist for transportation;Help with stairs or ramp for entrance   Can travel by private vehicle    yes    Equipment Recommendations None recommended by PT  Recommendations for Other Services       Functional Status Assessment Patient has had a recent decline in their functional status and demonstrates the ability to make significant improvements in function in a reasonable and predictable amount of time.     Precautions / Restrictions Precautions Precautions: Fall Recall of Precautions/Restrictions: Intact Restrictions Weight Bearing Restrictions Per Provider Order: Yes LLE Weight Bearing Per Provider Order: Weight bearing as tolerated      Mobility  Bed Mobility Overal bed mobility: Needs Assistance Bed Mobility: Supine to Sit     Supine to sit: Min assist     General bed mobility comments: min A guiding L LE off bed    Transfers Overall transfer level: Needs assistance Equipment used: Rolling walker (2 wheels) Transfers: Sit to/from Stand Sit to Stand: From elevated surface, Min assist                Ambulation/Gait Ambulation/Gait assistance: Supervision Gait Distance (Feet): 10 Feet Assistive device: Rolling walker (2 wheels) Gait Pattern/deviations:  Step-to pattern, Decreased step length - right, Decreased step length - left, Decreased stride length Gait velocity: decr     General Gait Details: decreased step length and weight shift onto LLE  Stairs            Wheelchair Mobility     Tilt Bed    Modified Rankin (Stroke Patients Only)       Balance Overall balance assessment: Needs assistance Sitting-balance support: Feet supported Sitting balance-Leahy Scale: Normal     Standing balance support: Bilateral upper extremity supported, During functional activity, Reliant on assistive device for balance Standing balance-Leahy Scale: Good                               Pertinent Vitals/Pain Pain Assessment Pain Assessment: Faces Faces Pain Scale: Hurts little more Pain Location: L knee during mobility Pain Descriptors / Indicators: Grimacing, Guarding, Sore Pain Intervention(s): Monitored during session, Repositioned    Home Living Family/patient expects to be discharged to:: Private residence Living Arrangements: Other relatives Available Help at Discharge: Family;Available 24 hours/day Type of Home: House Home Access: Ramped entrance       Home Layout: Laundry or work area in basement;Able to live on main level with bedroom/bathroom Home Equipment: Agricultural Consultant (2 wheels);Toilet riser;Lift chair;Shower seat Additional Comments: pt s/p recent TKA and still has all equipment    Prior Function Prior Level of Function : Independent/Modified Independent             Mobility Comments: using cane  prior to sx. ADLs Comments: independent     Extremity/Trunk Assessment   Upper Extremity Assessment Upper Extremity Assessment: Overall WFL for tasks assessed    Lower Extremity Assessment Lower Extremity Assessment: LLE deficits/detail LLE Coordination: decreased gross motor    Cervical / Trunk Assessment Cervical / Trunk Assessment: Normal  Communication   Communication Communication:  No apparent difficulties    Cognition Arousal: Alert Behavior During Therapy: WFL for tasks assessed/performed   PT - Cognitive impairments: No apparent impairments                         Following commands: Intact       Cueing Cueing Techniques: Verbal cues     General Comments      Exercises Total Joint Exercises Goniometric ROM: 0-80   Assessment/Plan    PT Assessment Patient needs continued PT services  PT Problem List Decreased range of motion;Decreased strength;Decreased activity tolerance;Decreased balance;Decreased mobility;Pain       PT Treatment Interventions DME instruction;Gait training;Functional mobility training;Therapeutic activities;Therapeutic exercise;Balance training;Neuromuscular re-education;Patient/family education    PT Goals (Current goals can be found in the Care Plan section)  Acute Rehab PT Goals Patient Stated Goal: to go home PT Goal Formulation: With patient Time For Goal Achievement: 10/20/24 Potential to Achieve Goals: Good    Frequency BID     Co-evaluation               AM-PAC PT 6 Clicks Mobility  Outcome Measure Help needed turning from your back to your side while in a flat bed without using bedrails?: A Little Help needed moving from lying on your back to sitting on the side of a flat bed without using bedrails?: A Little Help needed moving to and from a bed to a chair (including a wheelchair)?: A Little Help needed standing up from a chair using your arms (e.g., wheelchair or bedside chair)?: A Little Help needed to walk in hospital room?: A Little Help needed climbing 3-5 steps with a railing? : A Little 6 Click Score: 18    End of Session   Activity Tolerance: Patient tolerated treatment well Patient left: Other (comment);with family/visitor present (seated on side of bed, RN aware) Nurse Communication: Mobility status PT Visit Diagnosis: Other abnormalities of gait and mobility (R26.89);Muscle  weakness (generalized) (M62.81);Pain;Difficulty in walking, not elsewhere classified (R26.2) Pain - Right/Left: Left Pain - part of body: Knee    Time: 8499-8485 PT Time Calculation (min) (ACUTE ONLY): 14 min   Charges:   PT Evaluation $PT Eval Moderate Complexity: 1 Mod   PT General Charges $$ ACUTE PT VISIT: 1 Visit        Hailei Besser, PT, GCS 10/18/24,3:31 PM

## 2024-10-18 NOTE — Progress Notes (Signed)
 Patient is not able to walk the distance required to go the bathroom, or he is unable to safely negotiate stairs required to access the bathroom.  A 3in1 BSC will alleviate this problem.        Lollie Marrow, PA-C [facility]

## 2024-10-18 NOTE — Anesthesia Procedure Notes (Addendum)
 Spinal  Patient location during procedure: OR Start time: 10/18/2024 9:35 AM End time: 10/18/2024 9:51 AM Reason for block: surgical anesthesia Staffing Performed: anesthesiologist and resident/CRNA  Anesthesiologist: Vicci Camellia Glatter, MD Resident/CRNA: Niemzyk, Haiyun, CRNA Performed by: Vicci Camellia Glatter, MD Authorized by: Vicci Camellia Glatter, MD   Preanesthetic Checklist Completed: patient identified, IV checked, site marked, risks and benefits discussed, surgical consent, monitors and equipment checked, pre-op evaluation and timeout performed Spinal Block Patient position: sitting Prep: DuraPrep Patient monitoring: heart rate, cardiac monitor, continuous pulse ox and blood pressure Approach: midline Location: L3-4 Injection technique: single-shot Needle Needle type: Pencan  Needle gauge: 24 G Needle length: 9 cm Assessment Events: second provider Additional Notes ABORTED SPINAL L2/3 and L3/4 attempted by CRNA Haiyun. No paresthesias noted.  Second provider Vicci, MD with attempts midline unsuccessful. Attempt and left and then right paramedian L3/4 unsuccessful. No paraesthesias were noted.  Pt with no complaints and was okay proceeding with GETA.

## 2024-10-18 NOTE — Anesthesia Postprocedure Evaluation (Signed)
 Anesthesia Post Note  Patient: Andrew BITHER Sr.  Procedure(s) Performed: ARTHROPLASTY, KNEE, TOTAL (Left: Knee)  Patient location during evaluation: PACU Anesthesia Type: Spinal Level of consciousness: awake and alert Pain management: pain level controlled Vital Signs Assessment: post-procedure vital signs reviewed and stable Respiratory status: spontaneous breathing, nonlabored ventilation and respiratory function stable Cardiovascular status: blood pressure returned to baseline and stable Postop Assessment: no apparent nausea or vomiting Anesthetic complications: no   No notable events documented.   Last Vitals:  Vitals:   10/18/24 1430 10/18/24 1500  BP: (!) 159/78 (!) 164/87  Pulse: 65 71  Resp: 16 15  Temp:  (!) 36.4 C  SpO2: 96% 98%    Last Pain:  Vitals:   10/18/24 1500  TempSrc: Oral  PainSc: 0-No pain                 Camellia Merilee Louder
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
## 2024-10-18 NOTE — Discharge Instructions (Signed)
 Instructions after Total Knee Replacement   Andrew Vasquez M.D.     Dept. of Orthopaedics & Sports Medicine  [facility]  [address]  [place], [place]  [address]  Phone: [phone]   Fax: [phone]    DIET: Drink plenty of non-alcoholic fluids. Resume your normal diet. Include foods high in fiber.  ACTIVITY:  You may use crutches or a walker with weight-bearing as tolerated, unless instructed otherwise. You may be weaned off of the walker or crutches by your Physical Therapist.  Do NOT place pillows under the knee. Anything placed under the knee could limit your ability to straighten the knee.   Continue doing gentle exercises. Exercising will reduce the pain and swelling, increase motion, and prevent muscle weakness.   Please continue to use the TED compression stockings for 2 weeks. You may remove the stockings at night, but should reapply them in the morning. Do not drive or operate any equipment until instructed.  WOUND CARE:  Continue to use the PolarCare or ice packs periodically to reduce pain and swelling. You may begin showering 3 days after surgery with honeycomb dressing. Remove honeycomb dressing 7 days after surgery and continue showering. Allow dermabond to fall off on its own.  MEDICATIONS: You may resume your regular medications. Please take the pain medication as prescribed on the medication. Do not take pain medication on an empty stomach. You have been given a prescription for a blood thinner (Lovenox or Coumadin). Please take the medication as instructed. (NOTE: After completing a 2 week course of Lovenox, take one 81 mg Enteric-coated aspirin twice a day for 3 additional weeks. This along with elevation will help reduce the possibility of phlebitis in your operated leg.) Do not drive or drink alcoholic beverages when taking pain medications.  POSTOPERATIVE CONSTIPATION PROTOCOL Constipation - defined medically as fewer than three stools per  week and severe constipation as less than one stool per week.  One of the most common issues patients have following surgery is constipation.  Even if you have a regular bowel pattern at home, your normal regimen is likely to be disrupted due to multiple reasons following surgery.  Combination of anesthesia, postoperative narcotics, change in appetite and fluid intake all can affect your bowels.  In order to avoid complications following surgery, here are some recommendations in order to help you during your recovery period.  Colace (docusate) - Pick up an over-the-counter form of Colace or another stool softener and take twice a day as long as you are requiring postoperative pain medications.  Take with a full glass of water daily.  If you experience loose stools or diarrhea, hold the colace until you stool forms back up.  If your symptoms do not get better within 1 week or if they get worse, check with your doctor.  Dulcolax (bisacodyl) - Pick up over-the-counter and take as directed by the product packaging as needed to assist with the movement of your bowels.  Take with a full glass of water.  Use this product as needed if not relieved by Colace only.   MiraLax (polyethylene glycol) - Pick up over-the-counter to have on hand.  MiraLax is a solution that will increase the amount of water in your bowels to assist with bowel movements.  Take as directed and can mix with a glass of water, juice, soda, coffee, or tea.  Take if you go more than two days without a movement. Do not use MiraLax more than once per day.  Call your doctor if you are still constipated or irregular after using this medication for 7 days in a row.  If you continue to have problems with postoperative constipation, please contact the office for further assistance and recommendations.  If you experience "the worst abdominal pain ever" or develop nausea or vomiting, please contact the office immediatly for further recommendations for  treatment.   CALL THE OFFICE FOR: Temperature above 101 degrees Excessive bleeding or drainage on the dressing. Excessive swelling, coldness, or paleness of the toes. Persistent nausea and vomiting.  FOLLOW-UP:  You should have an appointment to return to the office in 14 days after surgery. Arrangements have been made for continuation of Physical Therapy (either home therapy or outpatient therapy).

## 2024-10-18 NOTE — Plan of Care (Signed)
   Problem: Activity: Goal: Ability to avoid complications of mobility impairment will improve Outcome: Progressing   Problem: Pain Management: Goal: Pain level will decrease with appropriate interventions Outcome: Progressing

## 2024-10-18 NOTE — Anesthesia Procedure Notes (Signed)
 Procedure Name: Intubation Date/Time: 10/18/2024 9:59 AM  Performed by: Bianca Raneri, CRNAPre-anesthesia Checklist: Patient identified, Emergency Drugs available, Suction available and Patient being monitored Patient Re-evaluated:Patient Re-evaluated prior to induction Oxygen Delivery Method: Circle System Utilized Preoxygenation: Pre-oxygenation with 100% oxygen Induction Type: IV induction Ventilation: Mask ventilation without difficulty Laryngoscope Size: McGrath and 3 Grade View: Grade I Tube type: Oral Tube size: 7.5 mm Number of attempts: 1 Airway Equipment and Method: Stylet and Oral airway Placement Confirmation: ETT inserted through vocal cords under direct vision, positive ETCO2 and breath sounds checked- equal and bilateral Secured at: 23 cm Tube secured with: Tape Dental Injury: Teeth and Oropharynx as per pre-operative assessment  Comments: Lips, tongue and teeth unchanged. Head and neck midline.

## 2024-10-18 NOTE — Op Note (Signed)
 Patient Name: Andrew Vasquez  FMW:[id number]  Pre-Operative Diagnosis: Left knee Osteoarthritis  Post-Operative Diagnosis: (same)  Procedure: Left Total Knee Arthroplasty  Components/Implants: Femur: Persona Size 12 CR Porous TM   Tibia: Persona Size H OsseoTi  Poly: 10mm MC  Patella: 38x80mm symmetric OsseoTi  Femoral Valgus Cut Angle: 5 degrees  Distal Femoral Re-cut: none  Patella Resurfacing: yes   Date of Surgery: 10/18/2024  Surgeon: Arthea Sheer MD  Assistant: Debby Amber PA (present and scrubbed throughout the case, critical for assistance with exposure, retraction, instrumentation, and closure)   Anesthesiologist: Vicci  Anesthesia: General   Tourniquet Time: 49 min  EBL: 25cc  IVF: 400cc  Complications: None   Brief history: The patient is a 78 year old male with a history of osteoarthritis of the left knee with pain limiting their range of motion and activities of daily living, which has failed multiple attempts at conservative therapy.  The risks and benefits of total knee arthroplasty as definitive surgical treatment were discussed with the patient, who opted to proceed with the operation.  After outpatient medical clearance and optimization was completed the patient was admitted to [facility] for the procedure.  All preoperative films were reviewed and an appropriate surgical plan was made prior to surgery. Preoperative range of motion was 5 to 110 with a 5 degree flexion contracture.   Description of procedure: The patient was brought to the operating room where laterality was confirmed by all those present to be the left side.   Spinal anesthesia was administered and the patient received an intravenous dose of antibiotics for surgical prophylaxis and a dose of tranexamic acid .  Patient is positioned supine on the operating room table with all bony prominences well-padded.  A well-padded tourniquet was applied to the left thigh.  The  knee was then prepped and draped in usual sterile fashion with multiple layers of adhesive and nonadhesive drapes.  All of those present in the operating room participated in a surgical timeout laterality and patient were confirmed.   An Esmarch was wrapped around the extremity and the leg was elevated and the knee flexed.  The tourniquet was inflated to a pressure of 250 mmHg. The Esmarch was removed and the leg was brought down to full extension.  The patella and tibial tubercle identified and outlined using a marking pen and a midline skin incision was made with a knife carried through the subcutaneous tissue down to the extensor retinaculum.  After exposure of the extensor mechanism the medial parapatellar arthrotomy was performed with a scalpel and electrocautery extending down medial and distal to the tibial tubercle taking care to avoid incising the patellar tendon.   A standard medial release was performed over the proximal tibia.  The knee was brought into extension in order to excise the fat pad taking care not to damage the patella tendon.  The superior soft tissue was removed from the anterior surface of the distal femur to visualize for the procedure.  The knee was then brought into flexion with the patella subluxed laterally and subluxing the tibia anteriorly.  The ACL was transected and removed with electrocautery and additional soft tissue was removed from the proximal surface of the tibia to fully expose. The PCL was found to be intact and was preserved.  An extramedullary tibial cutting guide was then applied to the leg with a spring-loaded ankle clamp placed around the distal tibia just above the malleoli the angulation of the guide was adjusted to give some posterior  slope in the tibial resection with an appropriate varus/valgus alignment.  The resection guide was then pinned to the proximal tibia and the proximal tibial surface was resected with an oscillating saw.  Careful attention was  paid to ensure the blade did not disrupt any of the soft tissues including any lateral or medial ligament.  Attention was then turned to the femur, with the knee slightly flexed a opening drill was used to enter the medullary canal of the femur.  After removing the drill marrow was suctioned out to decompress the distal femur.  An intramedullary femoral guide was then inserted into the drill hole and the alignment guide was seated firmly against the distal end of the medial femoral condyle.  The distal femoral cutting guide was then attached and pinned securely to the anterior surface of the femur and the intramedullary rod and alignment guide was removed.  Distal femur resection was then performed with an oscillating saw with retractors protecting medial and laterally.   The distal cutting block was then removed and the extension gap was checked with a spacer.  Extension gap was found to be appropriately sized to accommodate the spacer block.   The femoral sizing guide was then placed securely into the posterior condyles of the femur and the femoral size was measured and determined to be 12.  The size 12; 4-in-1 cutting guide was placed in position and secured with 2 pins.  The anterior posterior and chamfer resections were then performed with an oscillating saw.  Bony fragments and osteophytes were then removed.  Using a lamina spreader the posterior medial and lateral condyles were checked for additional osteophytes and posterior soft tissue remnants.  Any remaining meniscus was removed at this time.  Periarticular injection was performed in the meniscal rims and posterior capsule with aspiration performed to ensure no intravascular injection.   The tibia was then exposed and the tibial trial was pinned onto the plateau after confirming appropriate orientation and rotation.  Using the drill bushing the tibia was prepared to the appropriate drill depth.  Tibial broach impactor was then driven through the  punch guide using a mallet.  The femoral trial component was then inserted onto the femur. A trial tibial polyethylene bearing was then placed and the knee was reduced.  The knee achieved full extension with no hyperextension and was found to be balanced in flexion and extension with the trials in place.  The knee was then brought into full extension the patella was everted and held with 2 Kocher clamps.  The articular surface of the patella was then resected with an patella reamer and saw after careful measurement with a caliper.  The patella was then prepared with the drill guide and a trial patella was placed.  The knee was then taken through range of motion and it was found that the patella articulated appropriately with the trochlea and good patellofemoral motion without subluxation.    The correct final components for implantation were confirmed and opened by the circulator nurse.  The knee was irrigated with normal saline via pulsatile lavage to remove any bony debris or soft tissue.  The prepared surface of the tibia was exposed and the tibial component was implanted with good bony contact.  The femoral component was then placed and impacted showing good coverage and a good snug fit.  The patella was then cleared off and the patella compression tool was used to apply the patellar component with symmetric compression onto the patella.  The  tibial component was then irrigated and cleared of any debris and a real polyethylene component was placed and engaged with the locking mechanism.  The knee was then injected with the particular cocktail.  The knee was taken through range of motion and found to be stable in flexion and extension with patellar tracking.  The knee was then irrigated with copious amount of normal saline via pulsatile lavage to remove all loose bodies and other debris.  The knee was then irrigated with surgiphor betadine based wash and reirrigated with saline.  The tourniquet was then  dropped and all bleeding vessels were identified and coagulated.  The arthrotomy was approximated with #1 Vicryl and closed with #1 Stratafix suture.  The knee was brought into slight flexion and the subcutaneous tissues were closed with 0 Vicryl, 2-0 Vicryl and a running subcuticular 4-0 stratafix barbed suture.  Skin was then glued with Dermabond.  A sterile adhesive dressing was then placed along with a sequential compression device to the calf, a Ted stocking, and a cryotherapy cuff.   Sponge, needle, and Lap counts were all correct at the end of the case.   The patient was transferred off of the operating room table to a hospital bed, good pulses were found distally on the operative side.  The patient was transferred to the recovery room in stable condition.

## 2024-10-19 ENCOUNTER — Encounter: Payer: Self-pay | Admitting: Orthopedic Surgery

## 2024-10-19 ENCOUNTER — Other Ambulatory Visit: Payer: Self-pay

## 2024-10-19 DIAGNOSIS — M1712 Unilateral primary osteoarthritis, left knee: Secondary | ICD-10-CM | POA: Diagnosis not present

## 2024-10-19 LAB — CBC
HCT: 42.8 % (ref 39.0–52.0)
Hemoglobin: 14.3 g/dL (ref 13.0–17.0)
MCH: 28.9 pg (ref 26.0–34.0)
MCHC: 33.4 g/dL (ref 30.0–36.0)
MCV: 86.5 fL (ref 80.0–100.0)
Platelets: 169 K/uL (ref 150–400)
RBC: 4.95 MIL/uL (ref 4.22–5.81)
RDW: 12.9 % (ref 11.5–15.5)
WBC: 16.3 K/uL — ABNORMAL HIGH (ref 4.0–10.5)
nRBC: 0 % (ref 0.0–0.2)

## 2024-10-19 LAB — BASIC METABOLIC PANEL WITH GFR
Anion gap: 11 (ref 5–15)
BUN: 19 mg/dL (ref 8–23)
CO2: 29 mmol/L (ref 22–32)
Calcium: 9.3 mg/dL (ref 8.9–10.3)
Chloride: 96 mmol/L — ABNORMAL LOW (ref 98–111)
Creatinine, Ser: 0.91 mg/dL (ref 0.61–1.24)
GFR, Estimated: >60 mL/min (ref >60)
Glucose, Bld: 130 mg/dL — ABNORMAL HIGH (ref 70–99)
Potassium: 5 mmol/L (ref 3.5–5.1)
Sodium: 135 mmol/L (ref 135–145)

## 2024-10-19 NOTE — Progress Notes (Signed)
 Discharge Summary for Andrew JONETTA Blush Sr.  Discharge Plan: Patient will be discharged home as per the MD's order. We discussed prescriptions and follow-up appointments with the patient. The prescriptions were provided, and the medication list was explained in detail. Patient confirmed understanding of the instructions.  Skin Assessment: The patient's skin is clean, dry, and intact, with no signs of breakdown or tears. The IV catheter was removed, and the skin remains intact. The site shows no signs or symptoms of complications. A dressing and pressure were applied to the site. Patient reports no pain and has no complaints.  After-Visit Summary: An After-Visit Summary was printed and given to the patient. The following items were sent home with patient:    Polar care Medications Two honeycomb bandages Two TED hose placed on both patient legs  The patient was escorted via wheelchair and discharged home in a private vehicle.  Andrew Spargur D. Geofm, RN

## 2024-10-19 NOTE — Progress Notes (Signed)
   Subjective: 1 Day Post-Op Procedure(s) (LRB): ARTHROPLASTY, KNEE, TOTAL (Left) Patient reports pain as mild.   Patient is well, and has had no acute complaints or problems Denies any CP, SOB, ABD pain. We will continue therapy today.  Plan is to go Home after hospital stay.  Objective: Vital signs in last 24 hours: Temp:  [96.3 F (35.7 C)-98.3 F (36.8 C)] 98.3 F (36.8 C) (11/14 0406) Pulse Rate:  [52-71] 66 (11/14 0406) Resp:  [11-17] 14 (11/13 2308) BP: (128-206)/(74-100) 150/74 (11/14 0406) SpO2:  [92 %-100 %] 94 % (11/14 0406)  Intake/Output from previous day: 11/13 0701 - 11/14 0700 In: 1479.8 [P.O.:350; I.V.:703.4; IV Piggyback:426.4] Out: 2250 [Urine:2225; Blood:25] Intake/Output this shift: Total I/O In: -  Out: 100 [Urine:100]  Recent Labs    10/19/24 0615  HGB 14.3   Recent Labs    10/19/24 0615  WBC 16.3*  RBC 4.95  HCT 42.8  PLT 169   Recent Labs    10/19/24 0615  NA 135  K 5.0  CL 96*  CO2 29  BUN 19  CREATININE 0.91  GLUCOSE 130*  CALCIUM 9.3   No results for input(s): LABPT, INR in the last 72 hours.  EXAM General - Patient is Alert, Appropriate, and Oriented Extremity - Neurovascular intact Sensation intact distally Intact pulses distally Dorsiflexion/Plantar flexion intact Dressing - dressing C/D/I and no drainage Motor Function - intact, moving foot and toes well on exam.   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   Weight as of 10/08/24: 106.1 kg. Advance diet Up with therapy Pain well controlled Labs and VSS CM to assist with discharge to home with HHPT  DVT Prophylaxis - Lovenox , TED hose, and SCDs Weight-Bearing as tolerated to left leg   T. Medford Amber, PA-C [facility] 10/19/2024, 7:51 AM

## 2024-10-19 NOTE — Plan of Care (Signed)
 Documented

## 2024-10-19 NOTE — Progress Notes (Signed)
 Physical Therapy Treatment Patient Details Name: Andrew OGATA Sr. MRN: [id number] DOB: [birth year] Today's Date: 10/19/2024   History of Present Illness Patient is 78 year old s/p L TKA. R TKA 05/07/24, R hip fx in August.    PT Comments  Pt able to tolerate 233ft of amb using RW. Pt continues to demonstrate the following gait impairments: dec weight shift on LLE, dec ankle DF, mid foot strike on initial contact. Pt educated about HEP and verbalized understanding. Assistance provided for LB dressing. Pt continues to demonstrate dec strength/ROM. Pt anticipate pt will make good progress with continued skilled physical therapy services.     If plan is discharge home, recommend the following: A little help with walking and/or transfers;A little help with bathing/dressing/bathroom;Assist for transportation;Help with stairs or ramp for entrance   Can travel by private vehicle        Equipment Recommendations  None recommended by PT    Recommendations for Other Services       Precautions / Restrictions Precautions Precautions: Fall Recall of Precautions/Restrictions: Intact Restrictions Weight Bearing Restrictions Per Provider Order: Yes LLE Weight Bearing Per Provider Order: Weight bearing as tolerated     Mobility  Bed Mobility               General bed mobility comments: NT. Pt sitting at EOB pre/post session.    Transfers Overall transfer level: Needs assistance Equipment used: Rolling walker (2 wheels) Transfers: Sit to/from Stand Sit to Stand: Min assist           General transfer comment: MinA from lowest bed height to promote forward weight shift. Pt educated about how it will be easier to stand from higher surfaces.    Ambulation/Gait Ambulation/Gait assistance: Supervision Gait Distance (Feet): 200 Feet Assistive device: Rolling walker (2 wheels) Gait Pattern/deviations: Step-to pattern, Decreased step length - right, Decreased step length - left,  Decreased stride length Gait velocity: decr     General Gait Details: Initially stepping with midfoot on initial contact and  with dec ankle AF improving these motions with prolonged amb. Continued dec weight shift on LLE.   Stairs             Wheelchair Mobility     Tilt Bed    Modified Rankin (Stroke Patients Only)       Balance Overall balance assessment: Needs assistance Sitting-balance support: Feet supported Sitting balance-Leahy Scale: Normal     Standing balance support: Bilateral upper extremity supported, During functional activity, Reliant on assistive device for balance Standing balance-Leahy Scale: Good Standing balance comment: Steady while using RW . Pt able to pull up undergarments with CGA to maintain balance                            Communication Communication Communication: No apparent difficulties  Cognition Arousal: Alert Behavior During Therapy: WFL for tasks assessed/performed   PT - Cognitive impairments: No apparent impairments                       PT - Cognition Comments: very pleasant and agreeable Following commands: Intact      Cueing Cueing Techniques: Verbal cues  Exercises Other Exercises Other Exercises: Review of HEP. Pt verbalizes understanding. Other Exercises: SBA to bathroom    General Comments        Pertinent Vitals/Pain Pain Assessment Pain Assessment: Faces Faces Pain Scale: Hurts a little bit Pain Location: L  knee during mobility Pain Descriptors / Indicators: Discomfort Pain Intervention(s): Monitored during session    Home Living                          Prior Function            PT Goals (current goals can now be found in the care plan section) Acute Rehab PT Goals PT Goal Formulation: With patient Time For Goal Achievement: 10/20/24 Potential to Achieve Goals: Good Progress towards PT goals: Progressing toward goals    Frequency    BID      PT Plan       Co-evaluation              AM-PAC PT 6 Clicks Mobility   Outcome Measure  Help needed turning from your back to your side while in a flat bed without using bedrails?: A Little Help needed moving from lying on your back to sitting on the side of a flat bed without using bedrails?: A Little Help needed moving to and from a bed to a chair (including a wheelchair)?: A Little Help needed standing up from a chair using your arms (e.g., wheelchair or bedside chair)?: A Little Help needed to walk in hospital room?: A Little Help needed climbing 3-5 steps with a railing? : A Little 6 Click Score: 18    End of Session   Activity Tolerance: Patient tolerated treatment well Patient left: in bed;with call bell/phone within reach (sitting at EOB) Nurse Communication: Mobility status PT Visit Diagnosis: Other abnormalities of gait and mobility (R26.89);Muscle weakness (generalized) (M62.81);Pain;Difficulty in walking, not elsewhere classified (R26.2) Pain - Right/Left: Left Pain - part of body: Knee     Time: 9157-9096 PT Time Calculation (min) (ACUTE ONLY): 21 min  Charges:                            Andrew Vasquez, SPT    Andrew Vasquez 10/19/2024, 9:59 AM
